# Patient Record
Sex: Male | Born: 1975 | Race: White | Hispanic: No | Marital: Married | Smoking: Never smoker
Health system: Southern US, Community
[De-identification: ages and names within clinical notes are randomized; demographics above are authoritative.]

## PROBLEM LIST (undated history)

## (undated) DIAGNOSIS — K219 Gastro-esophageal reflux disease without esophagitis: Secondary | ICD-10-CM

## (undated) HISTORY — PX: HERNIA REPAIR: SHX51

---

## 2015-08-16 ENCOUNTER — Emergency Department (HOSPITAL_COMMUNITY)
Admission: EM | Admit: 2015-08-16 | Discharge: 2015-08-16 | Disposition: A | Discharge status: Home / Self Care | Payer: BC Managed Care – PPO | Source: Home / Self Care | Attending: Emergency Medicine | Admitting: Emergency Medicine

## 2015-08-16 ENCOUNTER — Other Ambulatory Visit (HOSPITAL_COMMUNITY)
Admission: RE | Admit: 2015-08-16 | Discharge: 2015-08-16 | Disposition: A | Discharge status: Home / Self Care | Payer: BC Managed Care – PPO | Source: Ambulatory Visit | Attending: Emergency Medicine | Admitting: Emergency Medicine

## 2015-08-16 DIAGNOSIS — R6889 Other general symptoms and signs: Secondary | ICD-10-CM | POA: Insufficient documentation

## 2015-08-16 LAB — POCT RAPID STREP A: Streptococcus, Group A Screen (Direct): NEGATIVE

## 2015-08-16 MED ORDER — OSELTAMIVIR PHOSPHATE 6 MG/ML PO SUSR: 75.0000 mg | Freq: Two times a day (BID) | ORAL | Status: DC

## 2015-08-16 MED ORDER — ACETAMINOPHEN 325 MG PO TABS
ORAL_TABLET | ORAL | Status: AC
  Filled 2015-08-16: qty 2

## 2015-08-16 MED ORDER — ACETAMINOPHEN 325 MG PO TABS
650.0000 mg | ORAL_TABLET | Freq: Once | ORAL | Status: AC
  Administered 2015-08-16: 650 mg via ORAL

## 2015-08-16 NOTE — ED Notes (Signed)
One day history of sore throat and fever, cough, headache, general aches and pain.  Clear to yellow/green phlegm intermittently

## 2015-08-16 NOTE — Discharge Instructions (Signed)
Influenza, Adult Influenza ("the flu") is a viral infection of the respiratory tract. It occurs more often in winter months because people spend more time in close contact with one another. Influenza can make you feel very sick. Influenza easily spreads from person to person (contagious). CAUSES  Influenza is caused by a virus that infects the respiratory tract. You can catch the virus by breathing in droplets from an infected person's cough or sneeze. You can also catch the virus by touching something that was recently contaminated with the virus and then touching your mouth, nose, or eyes. RISKS AND COMPLICATIONS You may be at risk for a more severe case of influenza if you smoke cigarettes, have diabetes, have chronic heart disease (such as heart failure) or lung disease (such as asthma), or if you have a weakened immune system. Elderly people and pregnant women are also at risk for more serious infections. The most common problem of influenza is a lung infection (pneumonia). Sometimes, this problem can require emergency medical care and may be life threatening. SIGNS AND SYMPTOMS  Symptoms typically last 4 to 10 days and may include:  Fever.  Chills.  Headache, body aches, and muscle aches.  Sore throat.  Chest discomfort and cough.  Poor appetite.  Weakness or feeling tired.  Dizziness.  Nausea or vomiting. DIAGNOSIS  Diagnosis of influenza is often made based on your history and a physical exam. A nose or throat swab test can be done to confirm the diagnosis. TREATMENT  In mild cases, influenza goes away on its own. Treatment is directed at relieving symptoms. For more severe cases, your health care provider may prescribe antiviral medicines to shorten the sickness. Antibiotic medicines are not effective because the infection is caused by a virus, not by bacteria. HOME CARE INSTRUCTIONS  Take medicines only as directed by your health care provider.  Use a cool mist humidifier  to make breathing easier.  Get plenty of rest until your temperature returns to normal. This usually takes 3 to 4 days.  Drink enough fluid to keep your urine clear or pale yellow.  Cover yourmouth and nosewhen coughing or sneezing,and wash your handswellto prevent thevirusfrom spreading.  Stay homefromwork orschool untilthe fever is gonefor at least 64full day. PREVENTION  An annual influenza vaccination (flu shot) is the best way to avoid getting influenza. An annual flu shot is now routinely recommended for all adults in the [place] IF:  You experiencechest pain, yourcough worsens,or you producemore mucus.  Youhave nausea,vomiting, ordiarrhea.  Your fever returns or gets worse. SEEK IMMEDIATE MEDICAL CARE IF:  You havetrouble breathing, you become short of breath,or your skin ornails becomebluish.  You have severe painor stiffnessin the neck.  You develop a sudden headache, or pain in the face or ear.  You have nausea or vomiting that you cannot control. MAKE SURE YOU:   Understand these instructions.  Will watch your condition.  Will get help right away if you are not doing well or get worse.   This information is not intended to replace advice given to you by your health care provider. Make sure you discuss any questions you have with your health care provider.   Document Released: 06/27/2000 Document Revised: 07/21/2014 Document Reviewed: 09/29/2011 Elsevier Interactive Patient Education 2016 Elsevier Inc. Oseltamivir oral suspension What is this medicine? OSELTAMIVIR (os el TAM i vir) is an antiviral medicine. It is used to prevent and to treat some kinds of influenza or the flu. It will not  work for colds or other viral infections. This medicine may be used for other purposes; ask your health care provider or pharmacist if you have questions. What should I tell my health care provider before I take this medicine? They need  to know if you have any of the following conditions: -heart disease -hereditary fructose intolerance -immune system problems -kidney disease -liver disease -lung disease -an unusual or allergic reaction to oseltamivir, other medicines, foods, dyes, or preservatives -pregnant or trying to get pregnant -breast-feeding How should I use this medicine? Take this medicine by mouth with a glass of water. Follow the directions on the prescription label. Start this medicine at the first sign of flu symptoms. Shake well before using. Use the oral syringe provided to measure the dose. Place the medicine directly into the mouth. Do not mix with any other liquid. Rinse the oral syringe and dry before the next use. You can take it with or without food. If it upsets your stomach, take it with food. Take your medicine at regular intervals. Do not take your medicine more often than directed. Take all of your medicine as directed even if you think you are better. Do not skip doses or stop your medicine early. Talk to your pediatrician regarding the use of this medicine in children. While this drug may be prescribed for children as young as 14 days for selected conditions, precautions do apply. Overdosage: If you think you have taken too much of this medicine contact a poison control center or emergency room at once. NOTE: This medicine is only for you. Do not share this medicine with others. What if I miss a dose? If you miss a dose, take it as soon as you remember. If it is almost time for your next dose (within 2 hours), take only that dose. Do not take double or extra doses. What may interact with this medicine? Interactions are not expected. This list may not describe all possible interactions. Give your health care provider a list of all the medicines, herbs, non-prescription drugs, or dietary supplements you use. Also tell them if you smoke, drink alcohol, or use illegal drugs. Some items may interact with  your medicine. What should I watch for while using this medicine? Visit your doctor or health care professional for regular check ups. Tell your doctor if your symptoms do not start to get better or if they get worse. If you have the flu, you may be at an increased risk of developing seizures, confusion, or abnormal behavior. This occurs early in the illness, and more frequently in children and teens. These events are not common, but may result in accidental injury to the patient. Families and caregivers of patients should watch for signs of unusual behavior and contact a doctor or health care professional right away if the patient shows signs of unusual behavior. This medicine is not a substitute for the flu shot. Talk to your doctor each year about an annual flu shot. What side effects may I notice from receiving this medicine? Side effects that you should report to your doctor or health care professional as soon as possible: -allergic reactions like skin rash, itching or hives, swelling of the face, lips, or tongue -anxiety, confusion, unusual behavior -breathing problems -hallucination, loss of contact with reality -redness, blistering, peeling or loosening of the skin, including inside the mouth -seizures Side effects that usually do not require medical attention (report to your doctor or health care professional if they continue or are bothersome): -  diarrhea -headache -nausea, vomiting -pain This list may not describe all possible side effects. Call your doctor for medical advice about side effects. You may report side effects to FDA at 1-800-FDA-1088. Where should I keep my medicine? Keep out of the reach of children. After this medicine is mixed by your pharmacist, store it in the refrigerator at 2 to 8 degrees C (36 to 46 degrees F). Do not freeze. Throw away any unused medicine after 10 days. NOTE: This sheet is a summary. It may not cover all possible information. If you have  questions about this medicine, talk to your doctor, pharmacist, or health care provider.    2016, Elsevier/Gold Standard. (2015-01-03 10:43:24)

## 2015-08-16 NOTE — ED Provider Notes (Signed)
CSN: [id number]     Arrival date & time 08/16/15  1917 History   First MD Initiated Contact with Patient 08/16/15 2012     Chief Complaint  Patient presents with  . Sore Throat  . Fever   (Consider location/radiation/quality/duration/timing/severity/associated sxs/prior Treatment) HPI Cough, fever, body aches, sore throat symptoms started today temp is as high as 102. Tylenol for fever. 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  No past medical history on file. No past surgical history on file. No family history on file. Social History  Substance Use Topics  . Smoking status: Not on file  . Smokeless tobacco: Not on file  . Alcohol Use: Not on file    Review of Systems ROS +'ve cough body aches, fever  Denies: HEADACHE, NAUSEA, ABDOMINAL PAIN, CHEST PAIN, CONGESTION, DYSURIA, SHORTNESS OF BREATH  Allergies  Review of patient's allergies indicates not on file.  Home Medications   Prior to Admission medications   Medication Sig Start Date End Date Taking? Authorizing Provider  oseltamivir (TAMIFLU) 6 MG/ML SUSR suspension Take 12.5 mLs (75 mg total) by mouth 2 (two) times daily. 08/16/15   Konrad Felix, PA   Meds Ordered and Administered this Visit   Medications  acetaminophen (TYLENOL) tablet 650 mg (650 mg Oral Given 08/16/15 2015)    BP 125/83 mmHg  Pulse 129  Temp(Src) 102.9 F (39.4 C) (Oral)  Resp 16  SpO2 94% No data found.   Physical Exam  Constitutional: He is oriented to person, place, and time. He appears well-developed and well-nourished. No distress.  HENT:  Head: Normocephalic and atraumatic.  Right Ear: External ear normal.  Left Ear: External ear normal.  Mouth/Throat: Oropharynx is clear and moist. No oropharyngeal exudate.  Eyes: Conjunctivae are normal.  Neck: Normal range of motion. Neck supple.  Cardiovascular: Normal rate.   Pulmonary/Chest: Effort normal and breath sounds normal. No  respiratory distress. He has no wheezes. He exhibits no tenderness.  Abdominal: Soft.  Musculoskeletal: Normal range of motion.  Neurological: He is alert and oriented to person, place, and time.  Skin: Skin is warm and dry.  Psychiatric: He has a normal mood and affect. His behavior is normal.    ED Course  Procedures (including critical care time)  Labs Review Labs Reviewed  POCT RAPID STREP A    Imaging Review No results found.   Visual Acuity Review  Right Eye Distance:   Left Eye Distance:   Bilateral Distance:    Right Eye Near:   Left Eye Near:    Bilateral Near:        Reviews rapid strep test with patient and his wife. Result is negative.  MDM   1. Flu-like symptoms    Patient is advised to continue home symptomatic treatment. Prescription for Tamiflu  sent pharmacy patient has indicated. Patient is advised that if there are new or worsening symptoms or attend the emergency department, or contact primary care provider. Instructions of care provided discharged home in stable condition. Return to work/school note provided.  THIS NOTE WAS GENERATED USING A VOICE RECOGNITION SOFTWARE PROGRAM. ALL REASONABLE EFFORTS  WERE MADE TO PROOFREAD THIS DOCUMENT FOR ACCURACY.     Konrad Felix, PA 08/16/15 2039

## 2015-08-17 ENCOUNTER — Encounter (HOSPITAL_COMMUNITY): Payer: Self-pay | Admitting: Emergency Medicine

## 2015-08-19 LAB — CULTURE, GROUP A STREP (THRC)

## 2019-06-14 ENCOUNTER — Other Ambulatory Visit: Payer: Self-pay | Admitting: Urology

## 2019-06-14 ENCOUNTER — Other Ambulatory Visit: Payer: Self-pay

## 2019-06-14 DIAGNOSIS — Z20822 Contact with and (suspected) exposure to covid-19: Secondary | ICD-10-CM

## 2019-06-14 DIAGNOSIS — D4411 Neoplasm of uncertain behavior of right adrenal gland: Secondary | ICD-10-CM

## 2019-06-16 LAB — NOVEL CORONAVIRUS, NAA: SARS-CoV-2, NAA: NOT DETECTED

## 2019-07-12 ENCOUNTER — Other Ambulatory Visit: Payer: BC Managed Care – PPO

## 2019-08-06 ENCOUNTER — Other Ambulatory Visit: Payer: Self-pay

## 2019-08-06 ENCOUNTER — Ambulatory Visit
Admission: RE | Admit: 2019-08-06 | Discharge: 2019-08-06 | Disposition: A | Discharge status: Home / Self Care | Payer: BC Managed Care – PPO | Source: Ambulatory Visit | Attending: Urology | Admitting: Urology

## 2019-08-06 DIAGNOSIS — D4411 Neoplasm of uncertain behavior of right adrenal gland: Secondary | ICD-10-CM

## 2019-08-06 MED ORDER — GADOBENATE DIMEGLUMINE 529 MG/ML IV SOLN
20.0000 mL | Freq: Once | INTRAVENOUS | Status: AC | PRN
  Administered 2019-08-06: 13:00:00 20 mL via INTRAVENOUS

## 2019-12-08 ENCOUNTER — Other Ambulatory Visit: Payer: Self-pay | Admitting: Urology

## 2019-12-08 DIAGNOSIS — D4411 Neoplasm of uncertain behavior of right adrenal gland: Secondary | ICD-10-CM

## 2020-01-28 ENCOUNTER — Ambulatory Visit
Admission: RE | Admit: 2020-01-28 | Discharge: 2020-01-28 | Disposition: A | Discharge status: Home / Self Care | Payer: BC Managed Care – PPO | Source: Ambulatory Visit | Attending: Urology | Admitting: Urology

## 2020-01-28 ENCOUNTER — Other Ambulatory Visit: Payer: Self-pay

## 2020-01-28 DIAGNOSIS — D4411 Neoplasm of uncertain behavior of right adrenal gland: Secondary | ICD-10-CM

## 2020-01-28 MED ORDER — GADOBENATE DIMEGLUMINE 529 MG/ML IV SOLN
20.0000 mL | Freq: Once | INTRAVENOUS | Status: AC | PRN
  Administered 2020-01-28: 20 mL via INTRAVENOUS

## 2020-03-08 ENCOUNTER — Ambulatory Visit: Payer: BC Managed Care – PPO | Attending: Internal Medicine

## 2020-03-08 DIAGNOSIS — Z23 Encounter for immunization: Secondary | ICD-10-CM

## 2020-03-08 NOTE — Progress Notes (Signed)
   Covid-19 Vaccination Clinic  Name:  John [name]    MRN: [id number] DOB: [birth year]  03/08/2020  John [name] was observed post Covid-19 immunization for 15 minutes without incident. He was provided with Vaccine Information Sheet and instruction to access the V-Safe system.   John [name] was instructed to call 911 with any severe reactions post vaccine: Marland Kitchen Difficulty breathing  . Swelling of face and throat  . A fast heartbeat  . A bad rash all over body  . Dizziness and weakness   Immunizations Administered    Name Date Dose VIS Date Route   Pfizer COVID-19 Vaccine 03/08/2020  1:45 PM 0.3 mL 09/07/2018 Intramuscular   Manufacturer: [place]   Lot: [id number]   [place]: [address]-7

## 2020-03-29 ENCOUNTER — Ambulatory Visit: Payer: BC Managed Care – PPO | Attending: Internal Medicine

## 2020-03-29 DIAGNOSIS — Z23 Encounter for immunization: Secondary | ICD-10-CM

## 2020-03-29 NOTE — Progress Notes (Signed)
.     Covid-19 Vaccination Clinic  Name:  John [name]    MRN: [id number] DOB: [birth year]  03/29/2020  [name] was observed post Covid-19 immunization for 15 minutes without incident. He was provided with Vaccine Information Sheet and instruction to access the V-Safe system.   [name] was instructed to call 911 with any severe reactions post vaccine: Marland Kitchen Difficulty breathing  . Swelling of face and throat  . A fast heartbeat  . A bad rash all over body  . Dizziness and weakness   Immunizations Administered    Name Date Dose VIS Date Route   Pfizer COVID-19 Vaccine 03/29/2020  2:11 PM 0.3 mL 09/07/2018 Intramuscular   Manufacturer: [place]   Lot: [id number]   [place]: [id number]

## 2021-11-07 IMAGING — MR MR ABDOMEN WO/W CM
13 of 18 series · 27 of 48 positions shown · IV contrast (multihance)
Comparison: Abdominal MRI 08/06/2019.

CLINICAL DATA: 43-year-old male with history of adrenal mass.
Follow-up study.

EXAM:
MRI ABDOMEN WITHOUT AND WITH CONTRAST
TECHNIQUE: Multiplanar multisequence MR imaging of the abdomen was performed
both before and after the administration of intravenous contrast.
CONTRAST:  20mL MULTIHANCE GADOBENATE DIMEGLUMINE 529 MG/ML IV SOLN

[Series 3: T2 · coronal · 5.0mm · 1.56mm/px · 1 of 36 slices shown (1 of 3)]
[im 1/36]
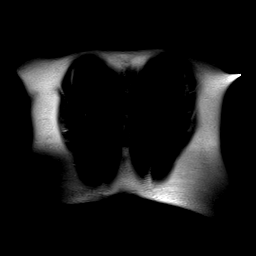

[Series 4: T2 · axial · 5.0mm · 1.56mm/px · 1 of 40 slices shown (2 of 3)]
[im 1/40]
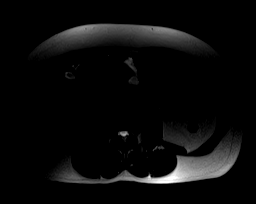

[Series 5: T2 · axial · 4.0mm · 0.78mm/px · 1 of 47 slices shown (3 of 3)]
[im 1/47]
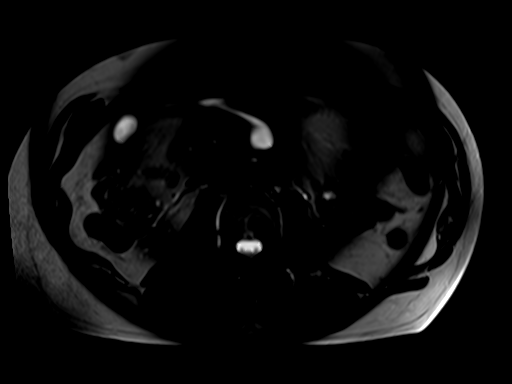

[Series 6: ep2d_diff_b50_500_800_p2-resp · 1 of 8 slices shown]
[im 1/8]
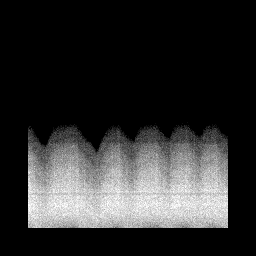

[Series 7: ep2d_diff_b50_500_800_p2 · axial · 6.0mm · 2.08mm/px · z∈[-88,+115]mm · 2 of 81 slices shown]
[im 1/81]
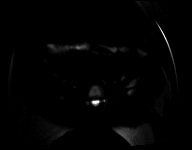
[im 81/81]
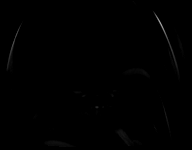

[Series 8: ep2d_diff_b50_500_800_p2_adc · axial · 6.0mm · 2.08mm/px · 1 of 27 slices shown]
[im 1/27]
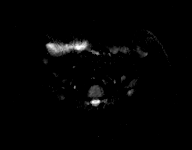

[Series 9: axial in out · axial · 5.5mm · 0.78mm/px · 1 of 64 slices shown]
[im 1/64]
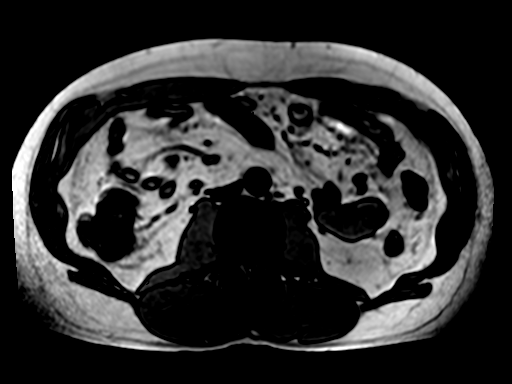

[Series 10: cor in out · coronal · 5.5mm · 0.78mm/px · 1 of 62 slices shown]
[im 1/62]
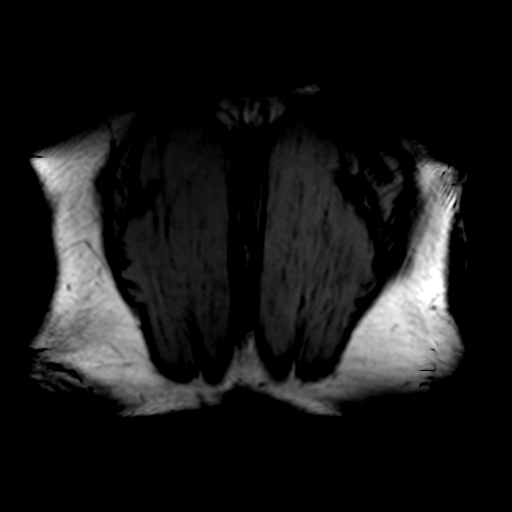

[Series 11: T1 dynamic · axial · non-contrast · 2.2mm · 0.82mm/px · z∈[-105,+122]mm · 4 of 104 slices shown]
[im 1/104]
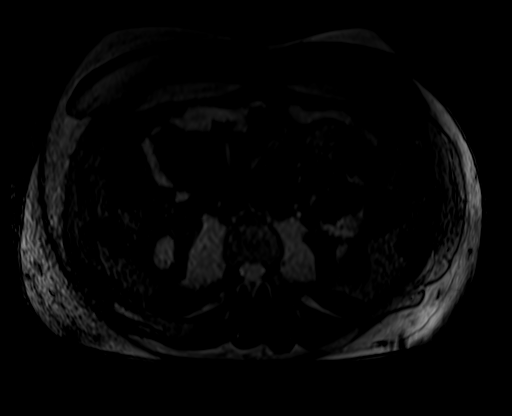
[im 35/104]
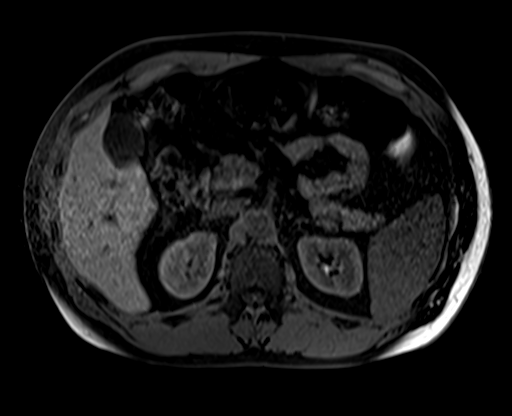
[im 69/104]
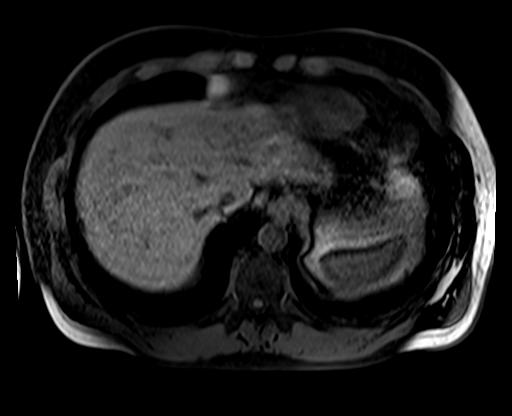
[im 104/104]
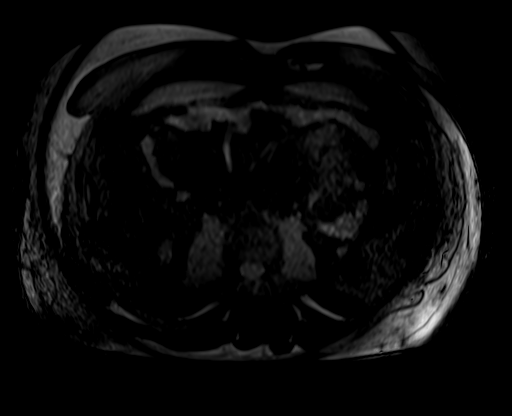

[Series 12: post 25 sec · axial · 2.2mm · 0.82mm/px · z∈[-105,+122]mm · 4 of 104 slices shown]
[im 1/104]
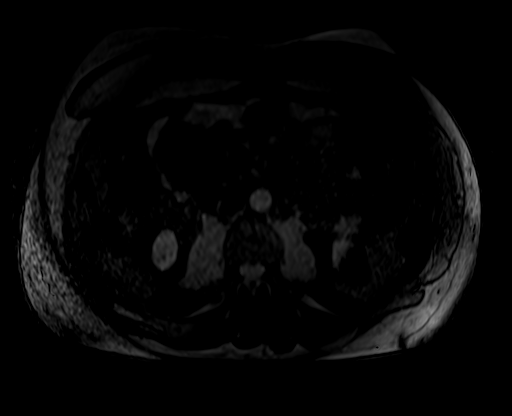
[im 35/104]
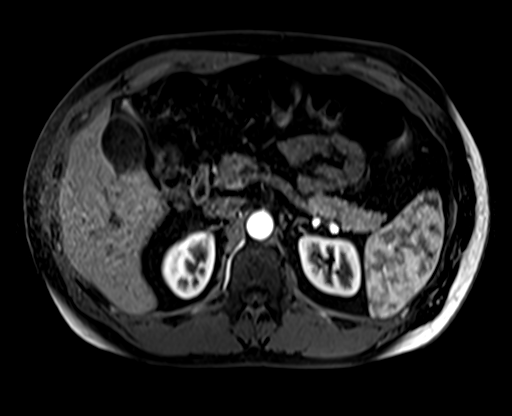
[im 69/104]
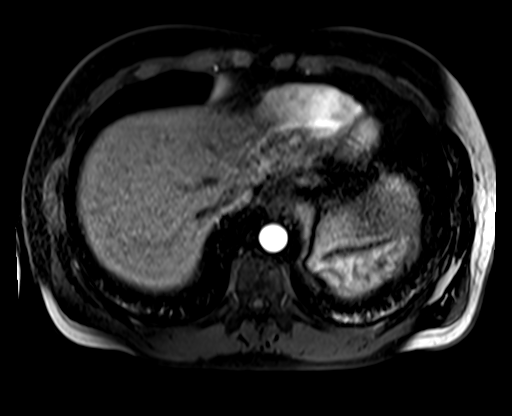
[im 104/104]
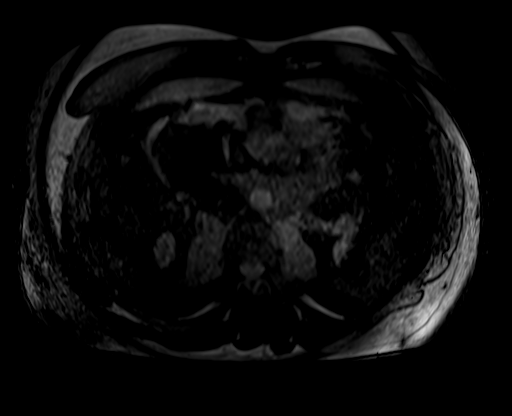

[Series 13: post 25 sec_sub · axial · 2.2mm · 0.82mm/px · z∈[-105,+122]mm · 4 of 104 slices shown]
[im 1/104]
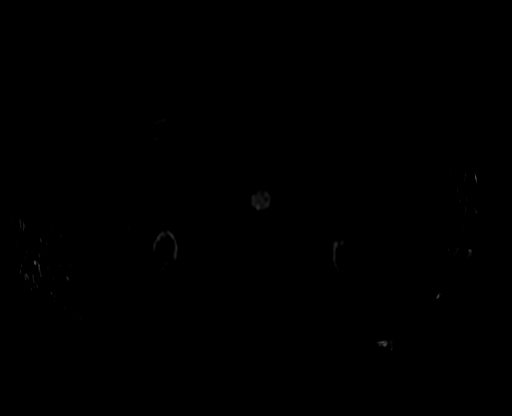
[im 35/104]
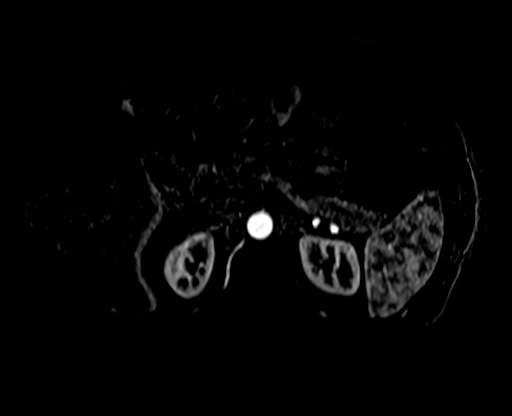
[im 69/104]
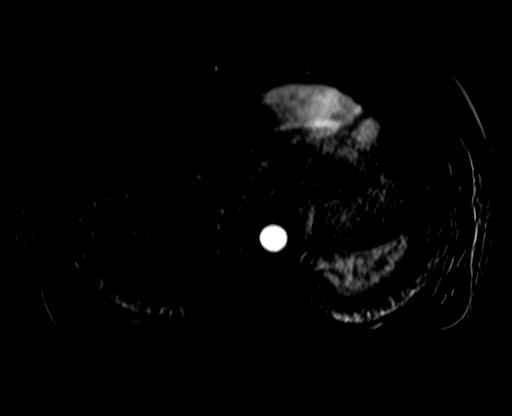
[im 104/104]
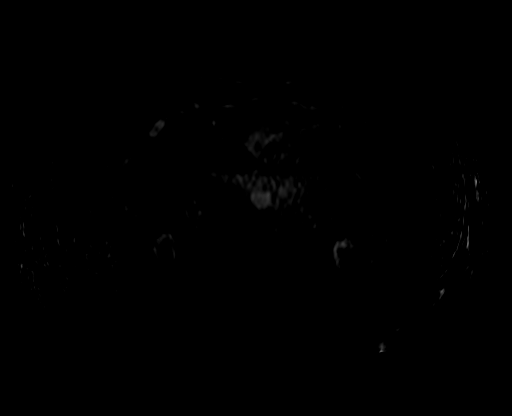

[Series 14: post 45 sec · axial · 2.2mm · 0.82mm/px · z∈[-105,+122]mm · 4 of 104 slices shown]
[im 1/104]
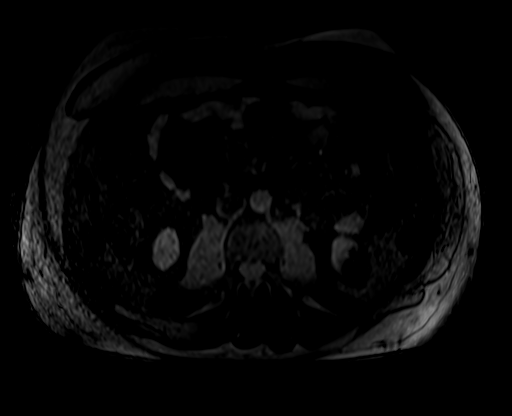
[im 35/104]
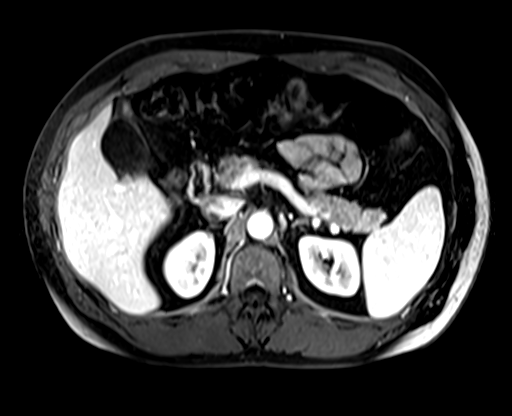
[im 69/104]
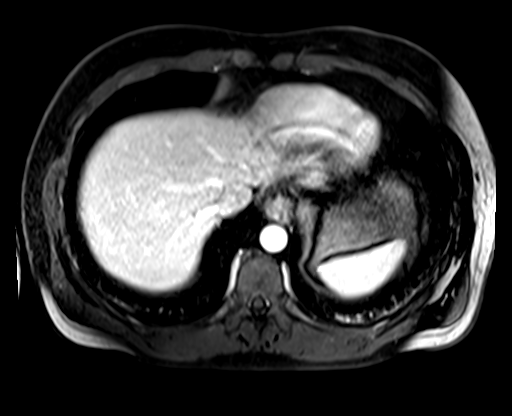
[im 104/104]
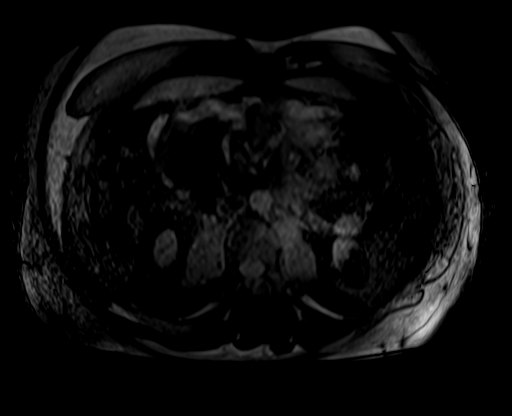

[Series 15: post 45 sec_sub · axial · 2.2mm · 0.82mm/px · z∈[-105,-30]mm · 2 of 104 slices shown]
[im 1/104]
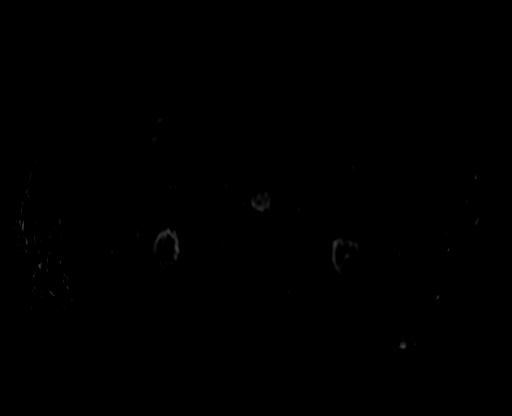
[im 35/104]
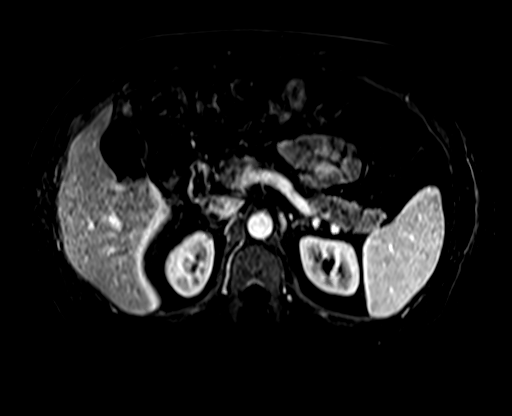

[27 of 48 positions shown; findings below may reference images not displayed]

FINDINGS: Lower chest: Unremarkable.

Hepatobiliary: No suspicious cystic or solid hepatic lesions. No
intra or extrahepatic biliary ductal dilatation gallbladder is
normal in appearance.

Pancreas: No pancreatic mass. No pancreatic ductal dilatation. No
pancreatic or peripancreatic fluid collections or inflammatory
changes.

Spleen:  Unremarkable.

Adrenals/Urinary Tract: Previously noted right adrenal lesion is
similar in size and appearance currently measuring 1.4 x 1.3 x
cm (axial image 15 of series 4 and coronal image 14 of series 3),
again demonstrating low T1 signal intensity, high T2 signal
intensity and no internal enhancement. Bilateral kidneys in the left
adrenal gland are normal in appearance. No hydroureteronephrosis in
the visualized portions of the abdomen.

Stomach/Bowel: Visualized portions are unremarkable.

Vascular/Lymphatic: No aneurysm identified in the visualized
portions of the abdomen. No lymphadenopathy noted in the visualized
portions of the abdomen.

Other: No significant volume of ascites noted in the visualized
portions of the peritoneal cavity.

Musculoskeletal: No aggressive appearing osseous lesions are noted
in the visualized portions of the skeleton.
IMPRESSION: 1. Stable size and appearance of benign appearing cystic lesion in
the right adrenal gland, most likely to represent a small adrenal
cyst. This is strongly favored to be benign. An additional follow-up
abdominal MRI with and without IV gadolinium could be considered in
12 months to ensure continued stability.

## 2022-03-20 ENCOUNTER — Ambulatory Visit: Payer: BC Managed Care – PPO | Admitting: Nurse Practitioner

## 2022-08-04 ENCOUNTER — Ambulatory Visit: Payer: BC Managed Care – PPO | Admitting: Family Medicine

## 2022-08-04 ENCOUNTER — Encounter: Payer: Self-pay | Admitting: Family Medicine

## 2022-08-04 VITALS — BP 125/91 | HR 80 | Temp 97.9°F | Ht 73.0 in | Wt 241.2 lb

## 2022-08-04 DIAGNOSIS — R0789 Other chest pain: Secondary | ICD-10-CM

## 2022-08-04 DIAGNOSIS — G2581 Restless legs syndrome: Secondary | ICD-10-CM | POA: Diagnosis not present

## 2022-08-04 DIAGNOSIS — R002 Palpitations: Secondary | ICD-10-CM | POA: Diagnosis not present

## 2022-08-04 DIAGNOSIS — K2100 Gastro-esophageal reflux disease with esophagitis, without bleeding: Secondary | ICD-10-CM

## 2022-08-04 MED ORDER — FLUTICASONE FUROATE-VILANTEROL 100-25 MCG/ACT IN AEPB: 1.0000 | INHALATION_SPRAY | Freq: Every day | RESPIRATORY_TRACT | 11 refills | Status: DC

## 2022-08-04 MED ORDER — PANTOPRAZOLE SODIUM 40 MG PO TBEC: 40.0000 mg | DELAYED_RELEASE_TABLET | Freq: Every day | ORAL | 11 refills | Status: DC

## 2022-08-04 NOTE — Progress Notes (Signed)
Subjective:  Patient ID: John [name], male    DOB: Jan 13, 1976  Age: 47 y.o. MRN: [id number]  CC: Dizziness, Chest Pain, and Fatigue   HPI Admiral Marcucci presents for dyspnea on exertion for walking an incline. Onset 1 year.HE can walk well on a level surface, even referees basketball games.  This AM on awakening had palpitations and chest pain. Lightheaded this AM. Felt like anxiety. Jittery inside. Denies extreme stressors. Married 28 years. Pretty good relationnship. Cough accompanies sometimes has to cough to get his breath.   Right leg feels tension and release, leg jumps. Occurs nightly.Job mostly desk work.      08/04/2022   12:58 PM  Depression screen PHQ 2/9  Decreased Interest 0  Down, Depressed, Hopeless 0  PHQ - 2 Score 0    History Davarious has no past medical history on file.   He has no past surgical history on file.   His family history is not on file.He reports that he has never smoked. He has never used smokeless tobacco. He reports that he does not currently use alcohol. He reports that he does not currently use drugs.    ROS Review of Systems  Constitutional:  Negative for fever.  Respiratory:  Positive for shortness of breath.   Cardiovascular:  Positive for chest pain and palpitations (see HPI).  Musculoskeletal:  Negative for arthralgias.  Skin:  Negative for rash.    Objective:  BP (!) 125/91   Pulse 80   Temp 97.9 F (36.6 C)   Ht '6\' 1"'$  (1.854 m)   Wt 241 lb 3.2 oz (109.4 kg)   SpO2 96%   BMI 31.82 kg/m   BP Readings from Last 3 Encounters:  08/04/22 (!) 125/91  08/16/15 125/83    Wt Readings from Last 3 Encounters:  08/04/22 241 lb 3.2 oz (109.4 kg)     Physical Exam Vitals reviewed.  Constitutional:      Appearance: He is well-developed.  HENT:     Head: Normocephalic and atraumatic.     Right Ear: External ear normal.     Left Ear: External ear normal.     Mouth/Throat:     Pharynx: No oropharyngeal exudate or posterior  oropharyngeal erythema.  Eyes:     Pupils: Pupils are equal, round, and reactive to light.  Cardiovascular:     Rate and Rhythm: Normal rate and regular rhythm.     Heart sounds: No murmur heard. Pulmonary:     Effort: No respiratory distress.     Breath sounds: Normal breath sounds.  Musculoskeletal:     Cervical back: Normal range of motion and neck supple.  Neurological:     Mental Status: He is alert and oriented to person, place, and time.    EKG : isolated upright qrs in V2. Otherwise nml R wave progession. No sign of ischemia.    Assessment & Plan:   Jarquavious was seen today for dizziness, chest pain and fatigue.  Diagnoses and all orders for this visit:  Chest tightness -     EKG 12-Lead -     CBC with Differential/Platelet -     CMP14+EGFR -     Lipid panel -     Ambulatory referral to Cardiology  Palpitation -     CBC with Differential/Platelet -     CMP14+EGFR -     Lipid panel -     Ambulatory referral to Cardiology  Restless leg syndrome  Gastroesophageal reflux disease with esophagitis without  hemorrhage -     CBC with Differential/Platelet -     CMP14+EGFR  Other orders -     pantoprazole (PROTONIX) 40 MG tablet; Take 1 tablet (40 mg total) by mouth daily. For stomach -     fluticasone furoate-vilanterol (BREO ELLIPTA) 100-25 MCG/ACT AEPB; Inhale 1 puff into the lungs daily.    Restless leg deferred to first visit with his PCP on Feb. 1.    I have discontinued Harsh Nigg's oseltamivir. I am also having him start on pantoprazole and fluticasone furoate-vilanterol.  Allergies as of 08/04/2022   No Known Allergies      Medication List        Accurate as of August 04, 2022  9:15 PM. If you have any questions, ask your nurse or doctor.          STOP taking these medications    oseltamivir 6 MG/ML Susr suspension Commonly known as: TAMIFLU Stopped by: Claretta Fraise, MD       TAKE these medications    fluticasone  furoate-vilanterol 100-25 MCG/ACT Aepb Commonly known as: Breo Ellipta Inhale 1 puff into the lungs daily. Started by: Claretta Fraise, MD   pantoprazole 40 MG tablet Commonly known as: PROTONIX Take 1 tablet (40 mg total) by mouth daily. For stomach Started by: Claretta Fraise, MD       His sx this AM may be related to his reflux. However, the DOE is of concern for heart. He has no risk factors except second hand smke as a child. Nonsmoker. Will have him use inhaler for now until more definitice workup is completed, including stress echo with cardiology.   Follow-up: Return in about 10 days (around 08/14/2022) for with PCP for recheck of heart sx as well as reflux, restless leg.Claretta Fraise, M.D.

## 2022-08-06 ENCOUNTER — Other Ambulatory Visit: Payer: BC Managed Care – PPO

## 2022-08-06 LAB — CMP14+EGFR
BUN: 12 mg/dL (ref 6–24)
CO2: 23 mmol/L (ref 20–29)
Chloride: 105 mmol/L (ref 96–106)
Glucose: 92 mg/dL (ref 70–99)

## 2022-08-06 LAB — CBC WITH DIFFERENTIAL/PLATELET
Basophils Absolute: 0.1 10*3/uL (ref 0.0–0.2)
Basos: 1 %
EOS (ABSOLUTE): 0.1 10*3/uL (ref 0.0–0.4)
Eos: 3 %
Immature Grans (Abs): 0 10*3/uL (ref 0.0–0.1)
Immature Granulocytes: 0 %
Lymphs: 42 %
Monocytes Absolute: 0.3 10*3/uL (ref 0.1–0.9)
Monocytes: 7 %

## 2022-08-06 LAB — LIPID PANEL
Chol/HDL Ratio: 5.3 ratio — ABNORMAL HIGH (ref 0.0–5.0)
Cholesterol, Total: 174 mg/dL (ref 100–199)
HDL: 33 mg/dL — ABNORMAL LOW (ref >39)
LDL Chol Calc (NIH): 118 mg/dL — ABNORMAL HIGH (ref 0–99)
Triglycerides: 128 mg/dL (ref 0–149)
VLDL Cholesterol Cal: 23 mg/dL (ref 5–40)

## 2022-08-07 LAB — CBC WITH DIFFERENTIAL/PLATELET
Hematocrit: 44.3 % (ref 37.5–51.0)
Hemoglobin: 15 g/dL (ref 13.0–17.7)
Lymphocytes Absolute: 1.8 10*3/uL (ref 0.7–3.1)
MCH: 28.2 pg (ref 26.6–33.0)
MCHC: 33.9 g/dL (ref 31.5–35.7)
MCV: 83 fL (ref 79–97)
Neutrophils Absolute: 2.1 10*3/uL (ref 1.4–7.0)
Neutrophils: 47 %
Platelets: 223 10*3/uL (ref 150–450)
RBC: 5.32 x10E6/uL (ref 4.14–5.80)
RDW: 12.7 % (ref 11.6–15.4)
WBC: 4.4 10*3/uL (ref 3.4–10.8)

## 2022-08-07 LAB — CMP14+EGFR
ALT: 21 IU/L (ref 0–44)
AST: 22 IU/L (ref 0–40)
Albumin/Globulin Ratio: 1.4 (ref 1.2–2.2)
Albumin: 4.2 g/dL (ref 4.1–5.1)
Alkaline Phosphatase: 85 IU/L (ref 44–121)
BUN/Creatinine Ratio: 9 (ref 9–20)
Bilirubin Total: 0.5 mg/dL (ref 0.0–1.2)
Calcium: 9.5 mg/dL (ref 8.7–10.2)
Creatinine, Ser: 1.34 mg/dL — ABNORMAL HIGH (ref 0.76–1.27)
Globulin, Total: 2.9 g/dL (ref 1.5–4.5)
Potassium: 4.2 mmol/L (ref 3.5–5.2)
Sodium: 141 mmol/L (ref 134–144)
eGFR: 66 mL/min/{1.73_m2} (ref >59)

## 2022-08-14 ENCOUNTER — Ambulatory Visit: Payer: BC Managed Care – PPO | Admitting: Family Medicine

## 2022-08-14 ENCOUNTER — Ambulatory Visit: Payer: BC Managed Care – PPO | Attending: Internal Medicine | Admitting: Internal Medicine

## 2022-08-14 ENCOUNTER — Encounter: Payer: Self-pay | Admitting: Internal Medicine

## 2022-08-14 VITALS — BP 120/76 | HR 70 | Ht 73.0 in | Wt 242.2 lb

## 2022-08-14 DIAGNOSIS — R0602 Shortness of breath: Secondary | ICD-10-CM | POA: Diagnosis not present

## 2022-08-14 DIAGNOSIS — R002 Palpitations: Secondary | ICD-10-CM

## 2022-08-14 NOTE — Progress Notes (Signed)
Cardiology Office Note   Date:  08/14/2022   ID:  Reuben Knoblock, DOB [birth year], MRN [id number]  PCP:  Gwenlyn Perking, FNP  Cardiologist:   Dorris Carnes, MD    Patient referred for CP   History of Present Illness: John [name] is a 47 y.o. male who is followed by Dr Cherrie Distance   Seen earlier this month    Complained of DOE   On the morning of visit he woke up with  palpitatoins and chest pain.   Dizzy    Felt jittery    The pt said a few nights prior had reffed 3 basketball games    Running up and down court  that night he felt fine   he says he woke up in middle of night with calves hurting   Says he could nt sleep  Got better over the next day Next night went to hockey game  Walking up to seat he got very SOB walking up stairs. Pt says in yard going to barn he has gotten SOB  that is 50 yards   No tightness in chest though   If stressed will get tight, will feel neck cramping and leg cramping  Last felt good several months ago  No preceding viral infection  Of note the pt runs on the  treadmill several times per week   Each session runs  3.5 miles  at 6 miles/hour   No problems doing this Did  it earlier this week without problem  Notes coughing more  No trigger  THe pt was just started on Breo and Protonix empirically by PCP  Has restless leg   syndome   Current Meds  Medication Sig   fluticasone furoate-vilanterol (BREO ELLIPTA) 100-25 MCG/ACT AEPB Inhale 1 puff into the lungs daily.   pantoprazole (PROTONIX) 40 MG tablet Take 1 tablet (40 mg total) by mouth daily. For stomach     Allergies:   Patient has no known allergies.   No past medical history on file.  No past surgical history on file.   Social History:  The patient  reports that he has never smoked. He has never used smokeless tobacco. He reports that he does not currently use alcohol. He reports that he does not currently use drugs.   Family History:  The patient's neg for for premature CAD   ROS:  Please  see the history of present illness. All other systems are reviewed and  Negative to the above problem except as noted.    PHYSICAL EXAM: VS:  BP 120/76   Pulse 70   Ht '6\' 1"'$  (1.854 m)   Wt 242 lb 3.2 oz (109.9 kg)   SpO2 97%   BMI 31.95 kg/m   GEN: Well nourished, well developed, in no acute distress  HEENT: normal  Neck: no JVD, carotid bruits, Cardiac: RRR; no murmur  No Le  edema  Respiratory:  CTA but mild upper airway wheeze with forced expiration.  GI: soft, nontender, nondistended, + BS   MS: no deformity Moving all extremities   Skin: warm and dry, no rash Neuro:  Strength and sensation are intact Psych: euthymic mood, full affect   EKG:  EKG is not ordered today.  On 08/04/22  NSR    Lipid Panel    Component Value Date/Time   CHOL 174 08/06/2022 0952   TRIG 128 08/06/2022 0952   HDL 33 (L) 08/06/2022 0952   CHOLHDL 5.3 (H) 08/06/2022 0952   LDLCALC  118 (H) 08/06/2022 0952      Wt Readings from Last 3 Encounters:  08/14/22 242 lb 3.2 oz (109.9 kg)  08/04/22 241 lb 3.2 oz (109.4 kg)      ASSESSMENT    Dyspnea    Atypical for cardiac   Running on treadmill at 6 mile/hour pace without problem   20 min at a time   ? Reactive airways     Mild upper airway wheeze   Reflux may contrib to this  Agree ith PPI Would set up for PFTs    Would get echo to confirm LV systolic/diastolic function  2Neck tightness Associated with mental stress, not running   ? GI in origin (spasm of esohagus)   Follow  Follow up based on test results   Current medicines are reviewed at length with the patient today.  The patient does not have concerns regarding medicines.  Signed, Dorris Carnes, MD  08/14/2022 2:29 PM    [facility] [address], [place], [place]  [address] Phone: [phone]; Fax: [phone]

## 2022-08-14 NOTE — Patient Instructions (Signed)
Medication Instructions:   *If you need a refill on your cardiac medications before your next appointment, please call your pharmacy*   Lab Work:  If you have labs (blood work) drawn today and your tests are completely normal, you will receive your results only by: [place] (if you have MyChart) OR A paper copy in the mail If you have any lab test that is abnormal or we need to change your treatment, we will call you to review the results.   Testing/Procedures: Your physician has recommended that you have a pulmonary function test. Pulmonary Function Tests are a group of tests that measure how well air moves in and out of your lungs.  Your physician has requested that you have an echocardiogram. Echocardiography is a painless test that uses sound waves to create images of your heart. It provides your doctor with information about the size and shape of your heart and how well your heart's chambers and valves are working. This procedure takes approximately one hour. There are no restrictions for this procedure. Please do NOT wear cologne, perfume, aftershave, or lotions (deodorant is allowed). Please arrive 15 minutes prior to your appointment time.    Follow-Up: At [facility], you and your health needs are our priority.  As part of our continuing mission to provide you with exceptional heart care, we have created designated Provider Care Teams.  These Care Teams include your primary Cardiologist (physician) and Advanced Practice Providers (APPs -  Physician Assistants and Nurse Practitioners) who all work together to provide you with the care you need, when you need it.  We recommend signing up for the patient portal called "MyChart".  Sign up information is provided on this After Visit Summary.  MyChart is used to connect with patients for Virtual Visits (Telemedicine).  Patients are able to view lab/test results, encounter notes, upcoming appointments, etc.  Non-urgent  messages can be sent to your provider as well.   To learn more about what you can do with MyChart, go to [email or web address].

## 2022-08-18 ENCOUNTER — Ambulatory Visit (INDEPENDENT_AMBULATORY_CARE_PROVIDER_SITE_OTHER): Payer: BC Managed Care – PPO | Admitting: Internal Medicine

## 2022-08-18 DIAGNOSIS — R0602 Shortness of breath: Secondary | ICD-10-CM

## 2022-08-18 DIAGNOSIS — R002 Palpitations: Secondary | ICD-10-CM

## 2022-08-18 LAB — PULMONARY FUNCTION TEST
DL/VA % pred: 100 %
DL/VA: 4.48 ml/min/mmHg/L
DLCO cor % pred: 98 %
DLCO cor: 32.48 ml/min/mmHg
DLCO unc % pred: 98 %
DLCO unc: 32.48 ml/min/mmHg
FEF 25-75 Post: 4.12 L/sec
FEF 25-75 Pre: 3.7 L/sec
FEF2575-%Change-Post: 11 %
FEF2575-%Pred-Post: 104 %
FEF2575-%Pred-Pre: 93 %
FEV1-%Change-Post: 2 %
FEV1-%Pred-Post: 98 %
FEV1-%Pred-Pre: 96 %
FEV1-Post: 4.38 L
FEV1-Pre: 4.26 L
FEV1FVC-%Change-Post: 4 %
FEV1FVC-%Pred-Pre: 98 %
FEV6-%Change-Post: -1 %
FEV6-%Pred-Post: 98 %
FEV6-%Pred-Pre: 100 %
FEV6-Post: 5.43 L
FEV6-Pre: 5.53 L
FEV6FVC-%Change-Post: 0 %
FEV6FVC-%Pred-Post: 103 %
FEV6FVC-%Pred-Pre: 103 %
FVC-%Change-Post: -1 %
FVC-%Pred-Post: 95 %
FVC-%Pred-Pre: 97 %
FVC-Post: 5.44 L
FVC-Pre: 5.53 L
Post FEV1/FVC ratio: 81 %
Post FEV6/FVC ratio: 100 %
Pre FEV1/FVC ratio: 77 %
Pre FEV6/FVC Ratio: 100 %
RV % pred: 114 %
RV: 2.43 L
TLC % pred: 101 %
TLC: 7.7 L

## 2022-08-18 NOTE — Progress Notes (Signed)
Full PFT completed today 

## 2022-08-21 ENCOUNTER — Encounter: Payer: Self-pay | Admitting: Family Medicine

## 2022-08-21 ENCOUNTER — Ambulatory Visit: Payer: BC Managed Care – PPO | Admitting: Family Medicine

## 2022-08-21 VITALS — BP 114/68 | HR 70 | Temp 97.2°F | Ht 73.0 in | Wt 237.5 lb

## 2022-08-21 DIAGNOSIS — R0602 Shortness of breath: Secondary | ICD-10-CM | POA: Diagnosis not present

## 2022-08-21 DIAGNOSIS — K219 Gastro-esophageal reflux disease without esophagitis: Secondary | ICD-10-CM | POA: Diagnosis not present

## 2022-08-21 DIAGNOSIS — Z1211 Encounter for screening for malignant neoplasm of colon: Secondary | ICD-10-CM

## 2022-08-21 DIAGNOSIS — Z0001 Encounter for general adult medical examination with abnormal findings: Secondary | ICD-10-CM

## 2022-08-21 DIAGNOSIS — N529 Male erectile dysfunction, unspecified: Secondary | ICD-10-CM

## 2022-08-21 DIAGNOSIS — Z0000 Encounter for general adult medical examination without abnormal findings: Secondary | ICD-10-CM

## 2022-08-21 DIAGNOSIS — R5383 Other fatigue: Secondary | ICD-10-CM

## 2022-08-21 DIAGNOSIS — R6882 Decreased libido: Secondary | ICD-10-CM

## 2022-08-21 DIAGNOSIS — R1319 Other dysphagia: Secondary | ICD-10-CM | POA: Diagnosis not present

## 2022-08-21 MED ORDER — TADALAFIL 10 MG PO TABS: 10.0000 mg | ORAL_TABLET | ORAL | 1 refills | Status: DC | PRN

## 2022-08-21 NOTE — Progress Notes (Signed)
Complete physical exam  Patient: John [name]   DOB: 09-Oct-1975   47 y.o. Male  MRN: [id number]  Subjective:    Chief Complaint  Patient presents with   Annual Exam    John [name] is a 47 y.o. male who presents today for a complete physical exam. He reports consuming a general diet. Gym/ health club routine includes basketball, cardio, and mod to heavy weightlifting. He generally feels fairly well. He reports sleeping fairly well. He does have additional problems to discuss today.   John [name] has recently been seen for atypical chest pain, cough, and shortness of breath. He has seen cardiology. Cardiology felt like his symptoms were possibly pulmonology vs esophageal spasm. He then saw pulmonology and had normal PFTs. He was started on protonix about 1 week ago for this.  Reports symptoms wax and wane. Chest pain and shortness of breath is not exertional and occurs randomly but often when he is talking excitedly. When he exhales he feels some irritation in his throat and chest and feels the need to cough. This is intermittent and occurs primarily when he talking quickly as well. He does also have a cough at night. Its a dry cough. He sometimes gets choked on saliva and sometimes with food. He reports heartburn that occurs daily, worse at night. He reports water brash at night. Denies nausea, vomiting, weight loss, or early satiety.   He also would like to have his testosterone checked. He reports decreased libido and decreased energy as well as ED. He has seen urology in the past and had a normal work up. He was prescribed medication which did work well for him.      08/21/2022    1:42 PM 08/04/2022   12:58 PM  Depression screen PHQ 2/9  Decreased Interest 0 0  Down, Depressed, Hopeless 0 0  PHQ - 2 Score 0 0  Altered sleeping 3   Tired, decreased energy 2   Change in appetite 0   Feeling bad or failure about yourself  0   Trouble concentrating 0   Moving slowly or fidgety/restless 0    Suicidal thoughts 0   PHQ-9 Score 5   Difficult doing work/chores Somewhat difficult       08/21/2022    1:43 PM  GAD 7 : Generalized Anxiety Score  Nervous, Anxious, on Edge 0  Control/stop worrying 0  Worry too much - different things 3  Trouble relaxing 2  Restless 2  Easily annoyed or irritable 2  Afraid - awful might happen 0  Total GAD 7 Score 9  Anxiety Difficulty Somewhat difficult      Most recent fall risk assessment:    08/21/2022    1:40 PM  Fall Risk   Falls in the past year? 0     Most recent depression screenings:    08/21/2022    1:42 PM 08/04/2022   12:58 PM  PHQ 2/9 Scores  PHQ - 2 Score 0 0  PHQ- 9 Score 5     Vision:Within last year and Dental: No current dental problems and Receives regular dental care  History reviewed. No pertinent past medical history.    Patient Care Team: Gwenlyn Perking, FNP as PCP - General (Family Medicine)   Outpatient Medications Prior to Visit  Medication Sig   fluticasone furoate-vilanterol (BREO ELLIPTA) 100-25 MCG/ACT AEPB Inhale 1 puff into the lungs daily.   pantoprazole (PROTONIX) 40 MG tablet Take 1 tablet (40 mg total) by mouth daily. For  stomach   No facility-administered medications prior to visit.    ROS Negative unless specially indicated above in HPI.     Objective:     BP 114/68   Pulse 70   Temp (!) 97.2 F (36.2 C) (Temporal)   Ht '6\' 1"'$  (1.854 m)   Wt 237 lb 8 oz (107.7 kg)   SpO2 98%   BMI 31.33 kg/m    Physical Exam Vitals and nursing note reviewed.  Constitutional:      General: He is not in acute distress.    Appearance: He is not ill-appearing.  HENT:     Head: Normocephalic.     Right Ear: Tympanic membrane, ear canal and external ear normal.     Left Ear: Tympanic membrane, ear canal and external ear normal.     Nose: Nose normal.     Mouth/Throat:     Mouth: Mucous membranes are moist.     Pharynx: Oropharynx is clear.  Eyes:     Extraocular Movements:  Extraocular movements intact.     Conjunctiva/sclera: Conjunctivae normal.     Pupils: Pupils are equal, round, and reactive to light.  Neck:     Thyroid: No thyroid mass, thyromegaly or thyroid tenderness.     Vascular: No carotid bruit.  Cardiovascular:     Rate and Rhythm: Normal rate and regular rhythm.     Pulses: Normal pulses.     Heart sounds: Normal heart sounds. No murmur heard.    No friction rub. No gallop.  Pulmonary:     Effort: Pulmonary effort is normal.     Breath sounds: Normal breath sounds.  Abdominal:     General: Bowel sounds are normal. There is no distension.     Palpations: Abdomen is soft. There is no mass.     Tenderness: There is no abdominal tenderness. There is no guarding.  Musculoskeletal:        General: No swelling. Normal range of motion.     Cervical back: Normal range of motion and neck supple. No tenderness.  Skin:    General: Skin is warm and dry.     Capillary Refill: Capillary refill takes less than 2 seconds.     Findings: No lesion or rash.  Neurological:     General: No focal deficit present.     Mental Status: He is alert and oriented to person, place, and time.  Psychiatric:        Mood and Affect: Mood normal.        Behavior: Behavior normal.        Thought Content: Thought content normal.      No results found for any visits on 08/21/22.     Assessment & Plan:    Routine Health Maintenance and Physical Exam John [name] was seen today for annual exam.  Diagnoses and all orders for this visit:  Routine general medical examination at a health care facility Reviewed labs from 08/06/22. -     TSH; Future  Shortness of breath Evaluated by cardiology and pulmonology. Suspect related to GERD, possibly esophageal spasm. Referral to GI placed.   Gastroesophageal reflux disease, unspecified whether esophagitis present Uncontrolled. Recently started Protonix. Discussed to continue. Will refer to GI given dysphagia.  -      Ambulatory referral to Gastroenterology  Esophageal dysphagia -     Ambulatory referral to Gastroenterology -     Phosphorus; Future -     TSH; Future  Colon cancer screening -  Ambulatory referral to Gastroenterology  Decreased energy Low libido Will return for labs as below.        -     TSH -     Testosterone,Free and Total; Future  Erectile dysfunction, unspecified erectile dysfunction type -     tadalafil (CIALIS) 10 MG tablet; Take 1 tablet (10 mg total) by mouth every other day as needed for erectile dysfunction. -     Testosterone,Free and Total; Future   Immunization History  Administered Date(s) Administered   PFIZER(Purple Top)SARS-COV-2 Vaccination 03/08/2020, 03/29/2020    Health Maintenance  Topic Date Due   DTaP/Tdap/Td (1 - Tdap) Never done   Fecal DNA (Cologuard)  Never done   INFLUENZA VACCINE  10/12/2022 (Originally 02/11/2022)   Hepatitis C Screening  08/22/2023 (Originally 02/23/1994)   HIV Screening  08/22/2023 (Originally 02/24/1991)   COVID-19 Vaccine (3 - 2023-24 season) 09/07/2023 (Originally 03/14/2022)   HPV VACCINES  Aged Out    Discussed health benefits of physical activity, and encouraged him to engage in regular exercise appropriate for his age and condition.  Problem List Items Addressed This Visit       Digestive   Esophageal dysphagia   Relevant Orders   Ambulatory referral to Gastroenterology   Phosphorus   TSH   Gastroesophageal reflux disease   Relevant Orders   Ambulatory referral to Gastroenterology     Other   Shortness of breath   Other Visit Diagnoses     Routine general medical examination at a health care facility    -  Primary   Relevant Orders   TSH   Colon cancer screening       Relevant Orders   Ambulatory referral to Gastroenterology   Decreased energy       Relevant Orders   Testosterone,Free and Total   TSH   Low libido       Relevant Orders   Testosterone,Free and Total   TSH   Erectile  dysfunction, unspecified erectile dysfunction type       Relevant Medications   tadalafil (CIALIS) 10 MG tablet   Other Relevant Orders   Testosterone,Free and Total      Return in about 6 weeks (around 10/02/2022) for GERD follow up.   The patient indicates understanding of these issues and agrees with the plan.  Gwenlyn Perking, FNP

## 2022-08-21 NOTE — Patient Instructions (Signed)
Health Maintenance, Male Adopting a healthy lifestyle and getting preventive care are important in promoting health and wellness. Ask your health care provider about: The right schedule for you to have regular tests and exams. Things you can do on your own to prevent diseases and keep yourself healthy. What should I know about diet, weight, and exercise? Eat a healthy diet  Eat a diet that includes plenty of vegetables, fruits, low-fat dairy products, and lean protein. Do not eat a lot of foods that are high in solid fats, added sugars, or sodium. Maintain a healthy weight Body mass index (BMI) is a measurement that can be used to identify possible weight problems. It estimates body fat based on height and weight. Your health care provider can help determine your BMI and help you achieve or maintain a healthy weight. Get regular exercise Get regular exercise. This is one of the most important things you can do for your health. Most adults should: Exercise for at least 150 minutes each week. The exercise should increase your heart rate and make you sweat (moderate-intensity exercise). Do strengthening exercises at least twice a week. This is in addition to the moderate-intensity exercise. Spend less time sitting. Even light physical activity can be beneficial. Watch cholesterol and blood lipids Have your blood tested for lipids and cholesterol at 47 years of age, then have this test every 5 years. You may need to have your cholesterol levels checked more often if: Your lipid or cholesterol levels are high. You are older than 47 years of age. You are at high risk for heart disease. What should I know about cancer screening? Many types of cancers can be detected early and may often be prevented. Depending on your health history and family history, you may need to have cancer screening at various ages. This may include screening for: Colorectal cancer. Prostate cancer. Skin cancer. Lung  cancer. What should I know about heart disease, diabetes, and high blood pressure? Blood pressure and heart disease High blood pressure causes heart disease and increases the risk of stroke. This is more likely to develop in people who have high blood pressure readings or are overweight. Talk with your health care provider about your target blood pressure readings. Have your blood pressure checked: Every 3-5 years if you are 18-39 years of age. Every year if you are 40 years old or older. If you are between the ages of 65 and 75 and are a current or former smoker, ask your health care provider if you should have a one-time screening for abdominal aortic aneurysm (AAA). Diabetes Have regular diabetes screenings. This checks your fasting blood sugar level. Have the screening done: Once every three years after age 45 if you are at a normal weight and have a low risk for diabetes. More often and at a younger age if you are overweight or have a high risk for diabetes. What should I know about preventing infection? Hepatitis B If you have a higher risk for hepatitis B, you should be screened for this virus. Talk with your health care provider to find out if you are at risk for hepatitis B infection. Hepatitis C Blood testing is recommended for: Everyone born from 1945 through 1965. Anyone with known risk factors for hepatitis C. Sexually transmitted infections (STIs) You should be screened each year for STIs, including gonorrhea and chlamydia, if: You are sexually active and are younger than 47 years of age. You are older than 47 years of age and your   health care provider tells you that you are at risk for this type of infection. Your sexual activity has changed since you were last screened, and you are at increased risk for chlamydia or gonorrhea. Ask your health care provider if you are at risk. Ask your health care provider about whether you are at high risk for HIV. Your health care provider  may recommend a prescription medicine to help prevent HIV infection. If you choose to take medicine to prevent HIV, you should first get tested for HIV. You should then be tested every 3 months for as long as you are taking the medicine. Follow these instructions at home: Alcohol use Do not drink alcohol if your health care provider tells you not to drink. If you drink alcohol: Limit how much you have to 0-2 drinks a day. Know how much alcohol is in your drink. In the [place] one drink equals one 12 oz bottle of beer (355 mL), one 5 oz glass of wine (148 mL), or one 1 oz glass of hard liquor (44 mL). Lifestyle Do not use any products that contain nicotine or tobacco. These products include cigarettes, chewing tobacco, and vaping devices, such as e-cigarettes. If you need help quitting, ask your health care provider. Do not use street drugs. Do not share needles. Ask your health care provider for help if you need support or information about quitting drugs. General instructions Schedule regular health, dental, and eye exams. Stay current with your vaccines. Tell your health care provider if: You often feel depressed. You have ever been abused or do not feel safe at home. Summary Adopting a healthy lifestyle and getting preventive care are important in promoting health and wellness. Follow your health care provider's instructions about healthy diet, exercising, and getting tested or screened for diseases. Follow your health care provider's instructions on monitoring your cholesterol and blood pressure. This information is not intended to replace advice given to you by your health care provider. Make sure you discuss any questions you have with your health care provider. Document Revised: 11/19/2020 Document Reviewed: 11/19/2020 Elsevier Patient Education  2023 [employer]  

## 2022-08-22 ENCOUNTER — Other Ambulatory Visit: Payer: BC Managed Care – PPO

## 2022-08-22 DIAGNOSIS — R6882 Decreased libido: Secondary | ICD-10-CM

## 2022-08-22 DIAGNOSIS — N529 Male erectile dysfunction, unspecified: Secondary | ICD-10-CM

## 2022-08-22 DIAGNOSIS — R5383 Other fatigue: Secondary | ICD-10-CM

## 2022-08-22 DIAGNOSIS — R1319 Other dysphagia: Secondary | ICD-10-CM

## 2022-08-22 DIAGNOSIS — Z0000 Encounter for general adult medical examination without abnormal findings: Secondary | ICD-10-CM

## 2022-08-23 LAB — TESTOSTERONE,FREE AND TOTAL

## 2022-08-23 LAB — PHOSPHORUS: Phosphorus: 3 mg/dL (ref 2.8–4.1)

## 2022-09-01 LAB — TESTOSTERONE,FREE AND TOTAL: Testosterone: 389 ng/dL (ref 264–916)

## 2022-09-01 LAB — TSH: TSH: 1.64 u[IU]/mL (ref 0.450–4.500)

## 2022-09-03 ENCOUNTER — Other Ambulatory Visit: Payer: Self-pay | Admitting: Family Medicine

## 2022-09-03 DIAGNOSIS — R7989 Other specified abnormal findings of blood chemistry: Secondary | ICD-10-CM

## 2022-09-04 ENCOUNTER — Other Ambulatory Visit (HOSPITAL_COMMUNITY): Payer: BC Managed Care – PPO

## 2022-09-26 ENCOUNTER — Ambulatory Visit: Payer: BC Managed Care – PPO | Admitting: Gastroenterology

## 2022-09-26 ENCOUNTER — Encounter: Payer: Self-pay | Admitting: Gastroenterology

## 2022-09-26 VITALS — BP 118/82 | Temp 80.0°F | Ht 73.0 in | Wt 236.0 lb

## 2022-09-26 DIAGNOSIS — Z1211 Encounter for screening for malignant neoplasm of colon: Secondary | ICD-10-CM | POA: Diagnosis not present

## 2022-09-26 DIAGNOSIS — R1310 Dysphagia, unspecified: Secondary | ICD-10-CM

## 2022-09-26 DIAGNOSIS — K219 Gastro-esophageal reflux disease without esophagitis: Secondary | ICD-10-CM | POA: Diagnosis not present

## 2022-09-26 DIAGNOSIS — K5900 Constipation, unspecified: Secondary | ICD-10-CM

## 2022-09-26 DIAGNOSIS — Z1212 Encounter for screening for malignant neoplasm of rectum: Secondary | ICD-10-CM

## 2022-09-26 MED ORDER — NA SULFATE-K SULFATE-MG SULF 17.5-3.13-1.6 GM/177ML PO SOLN: 1.0000 | Freq: Once | ORAL | 0 refills | Status: AC

## 2022-09-26 NOTE — Progress Notes (Unsigned)
HPI : John [name] is a very pleasant 47 year old male with no chronic medical history who is referred to [place] by Marjorie Smolder, FNP for further evaluation of dysphagia, GERD symptoms and colon cancer screening.  The patient states that he has had GERD symptoms for several years now.  He used to take TUMs as needed as this seemed to help, but in the past year his symptoms became more frequent until he was having symptoms every night.  Symptoms consist of burning pain in the chest and throat, worse at night and in supine position.  Although he would have daily symptoms, they were worse with certain foods such as pizza and [place] cuisine.   In addition to heartburn, he would have acid regurgitation, which was rare, but would be severe when it occurred. He has noted that coffee also makes his symptoms worse, so he does not drink it regularly.  He is a nonsmoker and rarely drinks alcohol.  He uses an adjustable bed to elevate the head of his bed at night.  A few months ago, he started having solid dysphagia.  This is described as the sensation of food sitting in his chest and taking a long time to go down.  This is noticed about once a week and is noticed more with meats.  No episodes of having to vomit stuck food.  He does not typically wash food down with water; he typically just waits for the sensation to pass before continuing to eat. No weight loss.  No liquid dysphagia.  No nausea/vomiting.  No hoarseness/chronic cough or globus sensation.  He started taking Protonix earlier this year and it has helped quite a bit with his heartburn.  No changes in his dysphagia thus far.  He has never undergone colon cancer screening.  He has no family history of GI malignancy.  He does have chronic constipation, typically only having a bowel movement 1-2x/week.  He denies hard stools or straining with defecation.  No abdominal pain, but he does feel bloated and full when he has gone several days without a bowel  movement.  Diarrhea is not a problems for him.  No blood in the stool. He tried taking daily Metamucil for a while but did not experience any improvement in his symptoms.  Otherwise he has not tried taking any medications for his constipation..  No past medical history on file.  Past Surgical History:  Procedure Laterality Date   HERNIA REPAIR     Family History  Problem Relation Age of Onset   Breast cancer Maternal Grandmother    Breast cancer Paternal Grandmother    Liver disease Neg Hx    Esophageal cancer Neg Hx    Colon cancer Neg Hx    Social History   Tobacco Use   Smoking status: Never   Smokeless tobacco: Never  Substance Use Topics   Alcohol use: Not Currently   Drug use: Not Currently   Current Outpatient Medications  Medication Sig Dispense Refill   pantoprazole (PROTONIX) 40 MG tablet Take 1 tablet (40 mg total) by mouth daily. For stomach 30 tablet 11   tadalafil (CIALIS) 10 MG tablet Take 1 tablet (10 mg total) by mouth every other day as needed for erectile dysfunction. 10 tablet 1   No current facility-administered medications for this visit.   No Known Allergies   Review of Systems: All systems reviewed and negative except where noted in HPI.    No results found.  Physical Exam:  BP 118/82   Temp (!) 80 F (26.7 C)   Ht 6\' 1"  (1.854 m)   Wt 236 lb (107 kg)   SpO2 96%   BMI 31.14 kg/m   Recorded body temperature was entered incorrectly Constitutional: Pleasant,well-developed, Caucasian male in no acute distress. HEENT: Normocephalic and atraumatic. Conjunctivae are normal. No scleral icterus. Neck supple.  Cardiovascular: Normal rate, regular rhythm.  Pulmonary/chest: Effort normal and breath sounds normal. No wheezing, rales or rhonchi. Abdominal: Soft, nondistended, nontender. Bowel sounds active throughout. There are no masses palpable. No hepatomegaly. Extremities: no edema Neurological: Alert and oriented to person place and time. Skin:  Skin is warm and dry. No rashes noted. Psychiatric: Normal mood and affect. Behavior is normal.  CBC    Component Value Date/Time   WBC 4.4 08/06/2022 0952   RBC 5.32 08/06/2022 0952   HGB 15.0 08/06/2022 0952   HCT 44.3 08/06/2022 0952   PLT 223 08/06/2022 0952   MCV 83 08/06/2022 0952   MCH 28.2 08/06/2022 0952   MCHC 33.9 08/06/2022 0952   RDW 12.7 08/06/2022 0952   LYMPHSABS 1.8 08/06/2022 0952   EOSABS 0.1 08/06/2022 0952   BASOSABS 0.1 08/06/2022 0952    CMP     Component Value Date/Time   NA 141 08/06/2022 0952   K 4.2 08/06/2022 0952   CL 105 08/06/2022 0952   CO2 23 08/06/2022 0952   GLUCOSE 92 08/06/2022 0952   BUN 12 08/06/2022 0952   CREATININE 1.34 (H) 08/06/2022 0952   CALCIUM 9.5 08/06/2022 0952   PROT 7.1 08/06/2022 0952   ALBUMIN 4.2 08/06/2022 0952   AST 22 08/06/2022 0952   ALT 21 08/06/2022 0952   ALKPHOS 85 08/06/2022 0952   BILITOT 0.5 08/06/2022 0952     ASSESSMENT AND PLAN: 47 year old male with chronic frequent typical GERD symptoms, improved with once daily Protonix and recent development of solid dysphagia.  Dysphagia most likely secondary to Schatzki ring or peptic stricture.  Will plan for EGD with dilation. We discussed the pathophysiology of GERD and the principles of GERD management to include lifestyle modifications  such as dietary discretion (avoidance of alcohol, tobacco, caffeinated and carbonated beverages, spicy/greasy foods, citrus, peppermint/chocolate), weight loss if applicable, head of bed elevation andconsuming last meal of day within 3 hours of bedtime; pharmacologic options to include PPIs, H2RAs and OTC antacids; and finally surgical or endoscopic fundoplication. For his constipation, I recommended he start taking MiraLax everyday.  He can increase this as needed to achieve a bowel movement every 1-2 days. Will schedule patient for initial average risk screening colonoscopy as well.  Dysphagia - EGD  GERD - Continue  once daily Protonix - Lifestyle modifications as discussed  CRC screening - Colonoscopy  Constipation - Start MiraLax daily  The details, risks (including bleeding, perforation, infection, missed lesions, medication reactions and possible hospitalization or surgery if complications occur), benefits, and alternatives to EGD/colonoscopy with possible biopsy, dilation and polypectomy were discussed with the patient and he consents to proceed.   Kiylah Loyer E. Candis Schatz, MD [facility]   Gwenlyn Perking, FNP

## 2022-09-26 NOTE — Patient Instructions (Addendum)
_______________________________________________________  If your blood pressure at your visit was 140/90 or greater, please contact your primary care physician to follow up on this.  _______________________________________________________  If you are age 47 or older, your body mass index should be between 23-30. Your Body mass index is 31.14 kg/m. If this is out of the aforementioned range listed, please consider follow up with your Primary Care Provider.  If you are age 80 or younger, your body mass index should be between 19-25. Your Body mass index is 31.14 kg/m. If this is out of the aformentioned range listed, please consider follow up with your Primary Care Provider.   You have been scheduled for a colonoscopy. Please follow written instructions given to you at your visit today.  Please pick up your prep supplies at the pharmacy within the next 1-3 days. If you use inhalers (even only as needed), please bring them with you on the day of your procedure.  Start Miralax daily.  ________________________________________________________  The [facility] providers would like to encourage you to use [facility] to communicate with providers for non-urgent requests or questions.  Due to long hold times on the telephone, sending your provider a message by [facility] may be a faster and more efficient way to get a response.  Please allow 48 business hours for a response.  Please remember that this is for non-urgent requests.  _____________________________________________________ It was a pleasure to see you today!  Thank you for trusting me with your gastrointestinal care!    Scott E.Candis Schatz, MD

## 2022-10-03 ENCOUNTER — Ambulatory Visit (HOSPITAL_COMMUNITY): Payer: BC Managed Care – PPO | Attending: Cardiovascular Disease

## 2022-10-03 DIAGNOSIS — R002 Palpitations: Secondary | ICD-10-CM | POA: Insufficient documentation

## 2022-10-03 DIAGNOSIS — R0602 Shortness of breath: Secondary | ICD-10-CM | POA: Diagnosis present

## 2022-10-03 LAB — ECHOCARDIOGRAM COMPLETE
Area-P 1/2: 3.08 cm2
MV M vel: 3.67 m/s
MV Peak grad: 53.9 mmHg
S' Lateral: 2.5 cm

## 2022-10-17 ENCOUNTER — Telehealth: Payer: Self-pay | Admitting: Internal Medicine

## 2022-10-17 DIAGNOSIS — Z01810 Encounter for preprocedural cardiovascular examination: Secondary | ICD-10-CM

## 2022-10-17 DIAGNOSIS — I340 Nonrheumatic mitral (valve) insufficiency: Secondary | ICD-10-CM

## 2022-10-17 DIAGNOSIS — R0602 Shortness of breath: Secondary | ICD-10-CM

## 2022-10-17 NOTE — Telephone Encounter (Signed)
Patient is returning call.  °

## 2022-10-17 NOTE — Telephone Encounter (Signed)
Patient stated he was returning a phone call. He saw his echo test results on his my chart but did not understand the result and does the patient need TEE? Will forward to MD and nurse.

## 2022-10-21 NOTE — Telephone Encounter (Signed)
Voicemail still full.

## 2022-10-21 NOTE — Telephone Encounter (Signed)
Unable to leave a voice message... mailbox full.   Per Dr Tenny Craw:   I reached patient       Reviewed echo with him   He is still getting SOB with hills  REcomm  TEE to further evaluate MV, mechanism of MR and severity  Please call me for scheduling

## 2022-10-22 NOTE — Telephone Encounter (Signed)
I spoke with the pt and he says he is working to get his voicemail fixed.. he agrees to have me schedule his TEE he is unsure about a date but will let me know if the next available does not work.   After calling central sched... I made the TEE for 10/30/22 with Dr Servando Salina... arrival at 7:30 am.   I was not able to get back in touch with the pt.   I will send a My chart as we both discussed at my initial call and he will let me know if the date does not work.

## 2022-10-30 NOTE — Telephone Encounter (Signed)
Called to talk with the pt about his TEE date change and he said something was wrong with his phone and said he will call me back.

## 2022-11-10 ENCOUNTER — Encounter: Payer: Self-pay | Admitting: Gastroenterology

## 2022-11-12 NOTE — Progress Notes (Signed)
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

## 2022-11-13 ENCOUNTER — Ambulatory Visit (HOSPITAL_COMMUNITY)
Admission: RE | Admit: 2022-11-13 | Payer: BC Managed Care – PPO | Source: Home / Self Care | Admitting: Cardiovascular Disease

## 2022-11-13 ENCOUNTER — Encounter (HOSPITAL_COMMUNITY): Admission: RE | Payer: Self-pay | Source: Home / Self Care

## 2022-11-13 SURGERY — ECHOCARDIOGRAM, TRANSESOPHAGEAL
Anesthesia: Monitor Anesthesia Care

## 2022-11-18 ENCOUNTER — Encounter: Payer: Self-pay | Admitting: Gastroenterology

## 2022-11-18 ENCOUNTER — Ambulatory Visit (AMBULATORY_SURGERY_CENTER): Payer: BC Managed Care – PPO | Admitting: Gastroenterology

## 2022-11-18 VITALS — BP 105/63 | HR 52 | Temp 98.6°F | Resp 11 | Ht 73.0 in | Wt 236.0 lb

## 2022-11-18 DIAGNOSIS — D123 Benign neoplasm of transverse colon: Secondary | ICD-10-CM

## 2022-11-18 DIAGNOSIS — R1310 Dysphagia, unspecified: Secondary | ICD-10-CM | POA: Diagnosis not present

## 2022-11-18 DIAGNOSIS — Z1211 Encounter for screening for malignant neoplasm of colon: Secondary | ICD-10-CM

## 2022-11-18 MED ORDER — ESOMEPRAZOLE MAGNESIUM 40 MG PO CPDR: 40.0000 mg | DELAYED_RELEASE_CAPSULE | Freq: Two times a day (BID) | ORAL | 1 refills | Status: DC

## 2022-11-18 MED ORDER — SODIUM CHLORIDE 0.9 % IV SOLN
500.0000 mL | Freq: Once | INTRAVENOUS | Status: DC
Start: 2022-11-18 — End: 2023-01-16

## 2022-11-18 NOTE — Progress Notes (Signed)
Uneventful anesthetic. Report to pacu rn. Vss. Care resumed by rn. 

## 2022-11-18 NOTE — Progress Notes (Signed)
Called to room to assist during endoscopic procedure.  Patient ID and intended procedure confirmed with present staff. Received instructions for my participation in the procedure from the performing physician.  

## 2022-11-18 NOTE — Op Note (Signed)
[facility] Patient Name: John [name] Procedure Date: 11/18/2022 11:25 AM MRN: [id number] Endoscopist: Lorin Picket E. Tomasa Rand , MD, [id number] Age: 47 Referring MD:  Date of Birth: 1976/01/13 Gender: Male Account #: [id number] Procedure:                Colonoscopy Indications:              Screening for colorectal malignant neoplasm, This                            is the patient's first colonoscopy Medicines:                Monitored Anesthesia Care Procedure:                Pre-Anesthesia Assessment:                           - Prior to the procedure, a History and Physical                            was performed, and patient medications and                            allergies were reviewed. The patient's tolerance of                            previous anesthesia was also reviewed. The risks                            and benefits of the procedure and the sedation                            options and risks were discussed with the patient.                            All questions were answered, and informed consent                            was obtained. Prior Anticoagulants: The patient has                            taken no anticoagulant or antiplatelet agents. ASA                            Grade Assessment: II - A patient with mild systemic                            disease. After reviewing the risks and benefits,                            the patient was deemed in satisfactory condition to                            undergo the procedure.  After obtaining informed consent, the colonoscope                            was passed under direct vision. Throughout the                            procedure, the patient's blood pressure, pulse, and                            oxygen saturations were monitored continuously. The                            Olympus CF-HQ190L SN [id number] was introduced through                            the anus and  advanced to the the terminal ileum,                            with identification of the appendiceal orifice and                            IC valve. The colonoscopy was performed without                            difficulty. The patient tolerated the procedure                            well. The quality of the bowel preparation was                            good. The terminal ileum, ileocecal valve,                            appendiceal orifice, and rectum were photographed.                            The bowel preparation used was SUPREP via split                            dose instruction. Scope In: 11:48:23 AM Scope Out: 12:06:08 PM Scope Withdrawal Time: 0 hours 11 minutes 48 seconds  Total Procedure Duration: 0 hours 17 minutes 45 seconds  Findings:                 The perianal and digital rectal examinations were                            normal. Pertinent negatives include normal                            sphincter tone and no palpable rectal lesions.                           Two sessile polyps were found in the transverse  colon. The polyps were 3 to 4 mm in size. These                            polyps were removed with a cold snare. Resection                            and retrieval were complete. Estimated blood loss                            was minimal.                           The exam was otherwise normal throughout the                            examined colon.                           The terminal ileum appeared normal.                           The retroflexed view of the distal rectum and anal                            verge was normal and showed no anal or rectal                            abnormalities. Complications:            No immediate complications. Estimated Blood Loss:     Estimated blood loss was minimal. Impression:               - Two 3 to 4 mm polyps in the transverse colon,                            removed with  a cold snare. Resected and retrieved.                           - The examined portion of the ileum was normal.                           - The distal rectum and anal verge are normal on                            retroflexion view.                           - The GI Genius (intelligent endoscopy module),                            computer-aided polyp detection system powered by AI                            was utilized to detect colorectal polyps through  enhanced visualization during colonoscopy. Recommendation:           - Patient has a contact number available for                            emergencies. The signs and symptoms of potential                            delayed complications were discussed with the                            patient. Return to normal activities tomorrow.                            Written discharge instructions were provided to the                            patient.                           - Resume previous diet.                           - Continue present medications.                           - Await pathology results.                           - Repeat colonoscopy (date not yet determined) for                            surveillance based on pathology results. Buffey Zabinski E. Tomasa Rand, MD 11/18/2022 12:16:23 PM This report has been signed electronically.

## 2022-11-18 NOTE — Progress Notes (Signed)
[facility] History and Physical   Primary Care Physician:  Gabriel Earing, FNP   Reason for Procedure:   Dysphagia, colon cancer screening  Plan:    EGD, colonoscopy     HPI: John [name] is a 47 y.o. male undergoing initial average risk screening colonoscopy.  He has no family history of colon cancer and no chronic lower GI symptoms other than constipation.  He has chronic typical GERD symptoms which are well controlled with Protonix and recent solid dysphagia without weight loss.    History reviewed. No pertinent past medical history.  Past Surgical History:  Procedure Laterality Date   HERNIA REPAIR      Prior to Admission medications   Medication Sig Start Date End Date Taking? Authorizing Provider  NONFORMULARY OR COMPOUNDED ITEM Apply 1 Application topically daily. HQ4%/TRET.05%/TAC.1%CR APPLY TO THE AFFECTED AREA(S) DAILY FOR 8 WEEKS AS DIRECTED. WEAR SPF! 09/29/22  Yes [provider]  pantoprazole (PROTONIX) 40 MG tablet Take 1 tablet (40 mg total) by mouth daily. For stomach 08/04/22  Yes Stacks, Broadus John, MD  tadalafil (CIALIS) 10 MG tablet Take 1 tablet (10 mg total) by mouth every other day as needed for erectile dysfunction. 08/21/22   Gabriel Earing, FNP    Current Outpatient Medications  Medication Sig Dispense Refill   NONFORMULARY OR COMPOUNDED ITEM Apply 1 Application topically daily. HQ4%/TRET.05%/TAC.1%CR APPLY TO THE AFFECTED AREA(S) DAILY FOR 8 WEEKS AS DIRECTED. WEAR SPF!     pantoprazole (PROTONIX) 40 MG tablet Take 1 tablet (40 mg total) by mouth daily. For stomach 30 tablet 11   tadalafil (CIALIS) 10 MG tablet Take 1 tablet (10 mg total) by mouth every other day as needed for erectile dysfunction. 10 tablet 1   Current Facility-Administered Medications  Medication Dose Route Frequency Provider Last Rate Last Admin   0.9 %  sodium chloride infusion  500 mL Intravenous Once Jenel Lucks, MD        Allergies as of  11/18/2022   (No Known Allergies)    Family History  Problem Relation Age of Onset   Breast cancer Maternal Grandmother    Breast cancer Paternal Grandmother    Liver disease Neg Hx    Esophageal cancer Neg Hx    Colon cancer Neg Hx     Social History   Socioeconomic History   Marital status: Married    Spouse name: Not on file   Number of children: 2   Years of education: Not on file   Highest education level: Not on file  Occupational History   Occupation: Patent examiner  Tobacco Use   Smoking status: Never   Smokeless tobacco: Never  Vaping Use   Vaping Use: Never used  Substance and Sexual Activity   Alcohol use: Not Currently   Drug use: Not Currently   Sexual activity: Not Currently  Other Topics Concern   Not on file  Social History Narrative   Not on file   Social Determinants of Health   Financial Resource Strain: Not on file  Food Insecurity: Not on file  Transportation Needs: Not on file  Physical Activity: Not on file  Stress: Not on file  Social Connections: Not on file  Intimate Partner Violence: Not on file    Review of Systems:  All other review of systems negative except as mentioned in the HPI.  Physical Exam: Vital signs BP 131/84   Pulse 68   Temp 98.6 F (37 C)   Resp 16  Ht 6\' 1"  (1.854 m)   Wt 236 lb (107 kg)   SpO2 99%   BMI 31.14 kg/m   General:   Alert,  Well-developed, well-nourished, pleasant and cooperative in NAD Airway:  Mallampati 1 Lungs:  Clear throughout to auscultation.   Heart:  Regular rate and rhythm; no murmurs, clicks, rubs,  or gallops. Abdomen:  Soft, nontender and nondistended. Normal bowel sounds.   Neuro/Psych:  Normal mood and affect. A and O x 3   Nykole Matos E. Tomasa Rand, MD [facility]

## 2022-11-18 NOTE — Patient Instructions (Addendum)
Endoscopy:   Handouts on Esophagitis And Reflux and hiatal hernia given to you today  Discontinue Pantoprazole, start Nexium 40 mg twice a day for 8 weeks ( # 120 with one refill order sent to your pharmacy)  Repeat Endoscopy in 8 weeks - appointment and instructions given to you by Belinda Block   Colonoscopy:  Handout on polyps given to you today  Await pathology results on polyps removed    YOU HAD AN ENDOSCOPIC PROCEDURE TODAY AT THE [facility]:   Refer to the procedure report that was given to you for any specific questions about what was found during the examination.  If the procedure report does not answer your questions, please call your gastroenterologist to clarify.  If you requested that your care partner not be given the details of your procedure findings, then the procedure report has been included in a sealed envelope for you to review at your convenience later.  YOU SHOULD EXPECT: Some feelings of bloating in the abdomen. Passage of more gas than usual.  Walking can help get rid of the air that was put into your GI tract during the procedure and reduce the bloating. If you had a lower endoscopy (such as a colonoscopy or flexible sigmoidoscopy) you may notice spotting of blood in your stool or on the toilet paper. If you underwent a bowel prep for your procedure, you may not have a normal bowel movement for a few days.  Please Note:  You might notice some irritation and congestion in your nose or some drainage.  This is from the oxygen used during your procedure.  There is no need for concern and it should clear up in a day or so.  SYMPTOMS TO REPORT IMMEDIATELY:  Following lower endoscopy (colonoscopy or flexible sigmoidoscopy):  Excessive amounts of blood in the stool  Significant tenderness or worsening of abdominal pains  Swelling of the abdomen that is new, acute  Fever of 100F or higher  Following upper endoscopy (EGD)  Vomiting of blood or  coffee ground material  New chest pain or pain under the shoulder blades  Painful or persistently difficult swallowing  New shortness of breath  Fever of 100F or higher  Black, tarry-looking stools  For urgent or emergent issues, a gastroenterologist can be reached at any hour by calling [phone]. Do not use MyChart messaging for urgent concerns.    DIET:  We do recommend a small meal at first, but then you may proceed to your regular diet.  Drink plenty of fluids but you should avoid alcoholic beverages for 24 hours.  ACTIVITY:  You should plan to take it easy for the rest of today and you should NOT DRIVE or use heavy machinery until tomorrow (because of the sedation medicines used during the test).    FOLLOW UP: Our staff will call the number listed on your records the next business day following your procedure.  We will call around 7:15- 8:00 am to check on you and address any questions or concerns that you may have regarding the information given to you following your procedure. If we do not reach you, we will leave a message.     If any biopsies were taken you will be contacted by phone or by letter within the next 1-3 weeks.  Please call us at [phone] if you have not heard about the biopsies in 3 weeks.    SIGNATURES/CONFIDENTIALITY: You and/or your care partner have signed paperwork which will  be entered into your electronic medical record.  These signatures attest to the fact that that the information above on your After Visit Summary has been reviewed and is understood.  Full responsibility of the confidentiality of this discharge information lies with you and/or your care-partner.

## 2022-11-18 NOTE — Op Note (Signed)
[facility] Patient Name: John [name] Procedure Date: 11/18/2022 11:32 AM MRN: [id number] Endoscopist: Lorin Picket E. Tomasa Rand , MD, [id number] Age: 47 Referring MD:  Date of Birth: April 20, 1976 Gender: Male Account #: [id number] Procedure:                Upper GI endoscopy Indications:              Dysphagia, symptoms of reflux currently controlled                            with once daily Protonix Medicines:                Monitored Anesthesia Care Procedure:                Pre-Anesthesia Assessment:                           - Prior to the procedure, a History and Physical                            was performed, and patient medications and                            allergies were reviewed. The patient's tolerance of                            previous anesthesia was also reviewed. The risks                            and benefits of the procedure and the sedation                            options and risks were discussed with the patient.                            All questions were answered, and informed consent                            was obtained. Prior Anticoagulants: The patient has                            taken no anticoagulant or antiplatelet agents. ASA                            Grade Assessment: II - A patient with mild systemic                            disease. After reviewing the risks and benefits,                            the patient was deemed in satisfactory condition to                            undergo the procedure.  After obtaining informed consent, the endoscope was                            passed under direct vision. Throughout the                            procedure, the patient's blood pressure, pulse, and                            oxygen saturations were monitored continuously. The                            Olympus Scope [id number] was introduced through the                            mouth, and advanced to  the second part of duodenum.                            The upper GI endoscopy was accomplished without                            difficulty. The patient tolerated the procedure                            well. Scope In: Scope Out: Findings:                 LA Grade D (one or more mucosal breaks involving at                            least 75% of esophageal circumference) esophagitis                            with no bleeding was found.                           One benign-appearing, intrinsic mild stenosis was                            found. This stenosis measured 1.2 cm (inner                            diameter) x 2 cm (in length). The stenosis was                            traversed.                           The exam of the esophagus was otherwise normal.                           A 6 cm hiatal hernia was present.                           The exam of the stomach was otherwise normal.  The examined duodenum was normal. Complications:            No immediate complications. Estimated Blood Loss:     Estimated blood loss: none. Impression:               - LA Grade D reflux esophagitis with no bleeding.                           - Benign-appearing esophageal stenosis. Not dilated                            due to significant inflammation                           - 6 cm hiatal hernia.                           - Normal examined duodenum.                           - No specimens collected. Recommendation:           - Patient has a contact number available for                            emergencies. The signs and symptoms of potential                            delayed complications were discussed with the                            patient. Return to normal activities tomorrow.                            Written discharge instructions were provided to the                            patient.                           - Resume previous diet.                            - Continue present medications.                           - Repeat upper endoscopy in 8 weeks to check                            healing.                           - Will discuss changing PPI vs increasing Protonix                            to twice daily Devani Odonnel E. Tomasa Rand, MD 11/18/2022 12:13:00 PM This report has been signed electronically.

## 2022-11-19 ENCOUNTER — Telehealth: Payer: Self-pay

## 2022-11-19 NOTE — Telephone Encounter (Signed)
  Follow up Call-     11/18/2022   10:35 AM  Call back number  Post procedure Call Back phone  # [phone]  Permission to leave phone message Yes     Patient questions:  Do you have a fever, pain , or abdominal swelling? No. Pain Score  0 *  Have you tolerated food without any problems? Yes.    Have you been able to return to your normal activities? Yes.    Do you have any questions about your discharge instructions: Diet   No. Medications  No. Follow up visit  No.  Do you have questions or concerns about your Care? No.  Actions: * If pain score is 4 or above: No action needed, pain <4.

## 2022-11-20 ENCOUNTER — Telehealth: Payer: Self-pay

## 2022-11-20 NOTE — Telephone Encounter (Signed)
  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 Pt did not call our office to cancel and noted he had an EGD and Colonoscopy on 5.7.24. Will forward to Dr Tenny Craw.

## 2022-11-21 NOTE — Telephone Encounter (Signed)
From Dr Tenny Craw:   If he does not call to reschedule, place him on list for call back/clnic visit in late early Sept   I can add him in/overbook.   Not sure how else to keep as reminder   Will call and follow up with the pt.

## 2022-11-24 NOTE — Progress Notes (Signed)
Mr. Nault, One polyp which I removed during your recent procedure was proven to be completely benign but is considered a "pre-cancerous" polyp that MAY have grown into cancer if it had not been removed.  The other polyp was not precancerous.  Studies shows that at least 20% of women over age 47 and 30% of men over age 69 have pre-cancerous polyps.  Based on current nationally recognized surveillance guidelines, I recommend that you have a repeat colonoscopy in 7 years.   As discussed, you still need to repeat upper endoscopy in 8 weeks to check healing of your esophagitis.  If you develop any new rectal bleeding, abdominal pain or significant bowel habit changes, please contact me before then.

## 2022-11-25 NOTE — Telephone Encounter (Signed)
Unable to LM for the pt... recall placed for the pt to be seen in Sept 2024.

## 2022-12-31 ENCOUNTER — Encounter: Payer: Self-pay | Admitting: Gastroenterology

## 2023-01-16 ENCOUNTER — Ambulatory Visit (AMBULATORY_SURGERY_CENTER): Payer: BC Managed Care – PPO | Admitting: Gastroenterology

## 2023-01-16 ENCOUNTER — Encounter: Payer: Self-pay | Admitting: Gastroenterology

## 2023-01-16 VITALS — BP 109/63 | HR 68 | Temp 96.6°F | Resp 21 | Ht 72.5 in | Wt 236.6 lb

## 2023-01-16 DIAGNOSIS — K2100 Gastro-esophageal reflux disease with esophagitis, without bleeding: Secondary | ICD-10-CM

## 2023-01-16 DIAGNOSIS — K222 Esophageal obstruction: Secondary | ICD-10-CM

## 2023-01-16 DIAGNOSIS — K3189 Other diseases of stomach and duodenum: Secondary | ICD-10-CM | POA: Diagnosis not present

## 2023-01-16 DIAGNOSIS — K209 Esophagitis, unspecified without bleeding: Secondary | ICD-10-CM

## 2023-01-16 DIAGNOSIS — K219 Gastro-esophageal reflux disease without esophagitis: Secondary | ICD-10-CM

## 2023-01-16 DIAGNOSIS — K317 Polyp of stomach and duodenum: Secondary | ICD-10-CM | POA: Diagnosis not present

## 2023-01-16 MED ORDER — ESOMEPRAZOLE MAGNESIUM 40 MG PO CPDR: 40.0000 mg | DELAYED_RELEASE_CAPSULE | Freq: Every day | ORAL | 3 refills | Status: DC

## 2023-01-16 MED ORDER — SODIUM CHLORIDE 0.9 % IV SOLN
500.00 mL | Freq: Once | INTRAVENOUS | Status: DC
Start: 2023-01-16 — End: 2023-01-16

## 2023-01-16 NOTE — Progress Notes (Signed)
Vss nad trans to pacu 

## 2023-01-16 NOTE — Progress Notes (Signed)
[facility] History and Physical   Primary Care Physician:  Gabriel Earing, FNP   Reason for Procedure:   Follow up esophagitis  Plan:    EGD     HPI: John [name] is a 47 y.o. male undergoing repeat EGD to assess reflux esophagitis.  He underwent an EGD in May with LA Grade D esophagitis, large hiatal hernia and low grade esophageal stricture. His symptoms have been well controlled on daily Nexium until this past week when he had a bad night after eating [place] food and has had persistent throat irritation since.    History reviewed. No pertinent past medical history.  Past Surgical History:  Procedure Laterality Date   HERNIA REPAIR      Prior to Admission medications   Medication Sig Start Date End Date Taking? Authorizing Provider  esomeprazole (NEXIUM) 40 MG capsule Take 1 capsule (40 mg total) by mouth 2 (two) times daily. 11/18/22  Yes Jenel Lucks, MD  NONFORMULARY OR COMPOUNDED ITEM Apply 1 Application topically daily. HQ4%/TRET.05%/TAC.1%CR APPLY TO THE AFFECTED AREA(S) DAILY FOR 8 WEEKS AS DIRECTED. WEAR SPF! 09/29/22   [provider]  tadalafil (CIALIS) 10 MG tablet Take 1 tablet (10 mg total) by mouth every other day as needed for erectile dysfunction. 08/21/22   Gabriel Earing, FNP    Current Outpatient Medications  Medication Sig Dispense Refill   esomeprazole (NEXIUM) 40 MG capsule Take 1 capsule (40 mg total) by mouth 2 (two) times daily. 120 capsule 1   NONFORMULARY OR COMPOUNDED ITEM Apply 1 Application topically daily. HQ4%/TRET.05%/TAC.1%CR APPLY TO THE AFFECTED AREA(S) DAILY FOR 8 WEEKS AS DIRECTED. WEAR SPF!     tadalafil (CIALIS) 10 MG tablet Take 1 tablet (10 mg total) by mouth every other day as needed for erectile dysfunction. 10 tablet 1   Current Facility-Administered Medications  Medication Dose Route Frequency Provider Last Rate Last Admin   0.9 %  sodium chloride infusion  500 mL Intravenous Once Tiajuana Amass E, MD       0.9 %  sodium chloride infusion  500 mL Intravenous Once Cirigliano, Vito V, DO        Allergies as of 01/16/2023   (No Known Allergies)    Family History  Problem Relation Age of Onset   Breast cancer Maternal Grandmother    Breast cancer Paternal Grandmother    Liver disease Neg Hx    Esophageal cancer Neg Hx    Colon cancer Neg Hx     Social History   Socioeconomic History   Marital status: Married    Spouse name: Not on file   Number of children: 2   Years of education: Not on file   Highest education level: Not on file  Occupational History   Occupation: Patent examiner  Tobacco Use   Smoking status: Never   Smokeless tobacco: Never  Vaping Use   Vaping Use: Never used  Substance and Sexual Activity   Alcohol use: Not Currently   Drug use: Not Currently   Sexual activity: Not Currently  Other Topics Concern   Not on file  Social History Narrative   Not on file   Social Determinants of Health   Financial Resource Strain: Not on file  Food Insecurity: Not on file  Transportation Needs: Not on file  Physical Activity: Not on file  Stress: Not on file  Social Connections: Not on file  Intimate Partner Violence: Not on file    Review of Systems:  All other review of systems negative except as mentioned in the HPI.  Physical Exam: Vital signs BP 115/73   Pulse 78   Temp (!) 96.6 F (35.9 C)   Ht 6' 0.5" (1.842 m)   Wt 236 lb 9.6 oz (107.3 kg)   SpO2 96%   BMI 31.65 kg/m   General:   Alert,  Well-developed, well-nourished, pleasant and cooperative in NAD Airway:  Mallampati 2 Lungs:  Clear throughout to auscultation.   Heart:  Regular rate and rhythm; no murmurs, clicks, rubs,  or gallops. Abdomen:  Soft, nontender and nondistended. Normal bowel sounds.   Neuro/Psych:  Normal mood and affect. A and O x 3   Ovide Dusek E. Tomasa Rand, MD [facility]

## 2023-01-16 NOTE — Patient Instructions (Signed)
Resume previous diet and medications. Continue Nexium indefinitely. Awaiting pathology results.   YOU HAD AN ENDOSCOPIC PROCEDURE TODAY AT THE [facility]:   Refer to the procedure report that was given to you for any specific questions about what was found during the examination.  If the procedure report does not answer your questions, please call your gastroenterologist to clarify.  If you requested that your care partner not be given the details of your procedure findings, then the procedure report has been included in a sealed envelope for you to review at your convenience later.  YOU SHOULD EXPECT: Some feelings of bloating in the abdomen. Passage of more gas than usual.  Walking can help get rid of the air that was put into your GI tract during the procedure and reduce the bloating. If you had a lower endoscopy (such as a colonoscopy or flexible sigmoidoscopy) you may notice spotting of blood in your stool or on the toilet paper. If you underwent a bowel prep for your procedure, you may not have a normal bowel movement for a few days.  Please Note:  You might notice some irritation and congestion in your nose or some drainage.  This is from the oxygen used during your procedure.  There is no need for concern and it should clear up in a day or so.  SYMPTOMS TO REPORT IMMEDIATELY:  Following upper endoscopy (EGD)  Vomiting of blood or coffee ground material  New chest pain or pain under the shoulder blades  Painful or persistently difficult swallowing  New shortness of breath  Fever of 100F or higher  Black, tarry-looking stools  For urgent or emergent issues, a gastroenterologist can be reached at any hour by calling [phone]. Do not use MyChart messaging for urgent concerns.    DIET:  We do recommend a small meal at first, but then you may proceed to your regular diet.  Drink plenty of fluids but you should avoid alcoholic beverages for 24 hours.  ACTIVITY:  You  should plan to take it easy for the rest of today and you should NOT DRIVE or use heavy machinery until tomorrow (because of the sedation medicines used during the test).    FOLLOW UP: Our staff will call the number listed on your records the next business day following your procedure.  We will call around 7:15- 8:00 am to check on you and address any questions or concerns that you may have regarding the information given to you following your procedure. If we do not reach you, we will leave a message.     If any biopsies were taken you will be contacted by phone or by letter within the next 1-3 weeks.  Please call us at [phone] if you have not heard about the biopsies in 3 weeks.    SIGNATURES/CONFIDENTIALITY: You and/or your care partner have signed paperwork which will be entered into your electronic medical record.  These signatures attest to the fact that that the information above on your After Visit Summary has been reviewed and is understood.  Full responsibility of the confidentiality of this discharge information lies with you and/or your care-partner.

## 2023-01-16 NOTE — Op Note (Signed)
[facility] Patient Name: John [name] Procedure Date: 01/16/2023 7:21 AM MRN: [id number] Endoscopist: Lorin Picket E. Tomasa Rand , MD, [id number] Age: 47 Referring MD:  Date of Birth: 27-Nov-1975 Gender: Male Account #: [id number] Procedure:                Upper GI endoscopy Indications:              Follow-up of reflux esophagitis Medicines:                Monitored Anesthesia Care Procedure:                Pre-Anesthesia Assessment:                           - Prior to the procedure, a History and Physical                            was performed, and patient medications and                            allergies were reviewed. The patient's tolerance of                            previous anesthesia was also reviewed. The risks                            and benefits of the procedure and the sedation                            options and risks were discussed with the patient.                            All questions were answered, and informed consent                            was obtained. Prior Anticoagulants: The patient has                            taken no anticoagulant or antiplatelet agents. ASA                            Grade Assessment: II - A patient with mild systemic                            disease. After reviewing the risks and benefits,                            the patient was deemed in satisfactory condition to                            undergo the procedure.                           After obtaining informed consent, the endoscope was  passed under direct vision. Throughout the                            procedure, the patient's blood pressure, pulse, and                            oxygen saturations were monitored continuously. The                            Olympus Scope [id number] was introduced through the                            mouth, and advanced to the second part of duodenum.                            The upper GI  endoscopy was accomplished without                            difficulty. The patient tolerated the procedure                            well. Scope In: Scope Out: Findings:                 The examined portions of the nasopharynx,                            oropharynx and larynx were normal.                           One benign-appearing, intrinsic mild stenosis was                            found at the gastroesophageal junction. This                            stenosis measured 1.2 cm (inner diameter) x 1 cm                            (in length). The stenosis was traversed. The scope                            was withdrawn. Dilation was performed with a                            Maloney dilator with mild resistance at 54 Fr. The                            dilation site was examined and showed no change.                            Estimated blood loss: none.                           A 5 cm hiatal  hernia was present.                           The exam of the stomach was otherwise normal.                           Multiple 3 to 10 mm sessile polyps were found in                            the duodenal bulb. Biopsies were taken with a cold                            forceps for histology. Estimated blood loss was                            minimal.                           The exam of the duodenum was otherwise normal. Complications:            No immediate complications. Estimated Blood Loss:     Estimated blood loss was minimal. Impression:               - The examined portions of the nasopharynx,                            oropharynx and larynx were normal.                           - Benign-appearing esophageal stenosis. Dilated.                            The previously noted esophagitis has healed.                           - 5 cm hiatal hernia.                           - Multiple duodenal polyps. Biopsied. Recommendation:           - Patient has a contact number available  for                            emergencies. The signs and symptoms of potential                            delayed complications were discussed with the                            patient. Return to normal activities tomorrow.                            Written discharge instructions were provided to the                            patient.                           -  Resume previous diet.                           - Continue present medications, including Nexium                            indefinitely.                           - Await pathology results. Jamilet Ambroise E. Tomasa Rand, MD 01/16/2023 8:24:36 AM This report has been signed electronically.

## 2023-01-19 ENCOUNTER — Telehealth: Payer: Self-pay | Admitting: *Deleted

## 2023-01-19 NOTE — Telephone Encounter (Signed)
Attempted to call patient for their post-procedure follow-up call. Unable to get in touch with patient. If you have any questions or concerns following your procedure from 01/16/23 at [facility], please give [place] a call.

## 2023-01-22 NOTE — Progress Notes (Signed)
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  This is a benign finding in which gastric mucosa is found in the first portion of the duodenum.  It is not thought to cause any symptoms or increase the risk of cancer or any other condition.  No further evaluation or treatment is recommended.

## 2023-02-13 ENCOUNTER — Encounter: Payer: Self-pay | Admitting: Family Medicine

## 2023-05-27 ENCOUNTER — Encounter: Payer: Self-pay | Admitting: Family Medicine

## 2023-05-27 ENCOUNTER — Ambulatory Visit: Payer: BC Managed Care – PPO | Admitting: Family Medicine

## 2023-05-27 ENCOUNTER — Telehealth: Payer: Self-pay

## 2023-05-27 VITALS — BP 123/83 | HR 85 | Temp 98.3°F | Ht 72.5 in | Wt 243.0 lb

## 2023-05-27 DIAGNOSIS — J069 Acute upper respiratory infection, unspecified: Secondary | ICD-10-CM

## 2023-05-27 MED ORDER — DM-GUAIFENESIN ER 30-600 MG PO TB12
1.0000 | ORAL_TABLET | Freq: Two times a day (BID) | ORAL | 1 refills | Status: DC
Start: 2023-05-27 — End: 2023-10-17

## 2023-05-27 MED ORDER — FLUTICASONE PROPIONATE 50 MCG/ACT NA SUSP
1.0000 | Freq: Two times a day (BID) | NASAL | 1 refills | Status: DC | PRN
Start: 2023-05-27 — End: 2023-06-10

## 2023-05-27 NOTE — Progress Notes (Signed)
BP 123/83   Pulse 85   Temp 98.3 F (36.8 C)   Ht 6' 0.5" (1.842 m)   Wt 243 lb (110.2 kg)   SpO2 95%   BMI 32.50 kg/m    Subjective:   Patient ID: John [name], male    DOB: Sep 16, 1975, 47 y.o.   MRN: [id number]  HPI: John [name] is a 47 y.o. male presenting on 05/27/2023 for No chief complaint on file.   HPI Congestion cough Patient is coming in today with congestion and cough it has been going on over the past couple days.  He did have a grandson's been fighting RSV over the past couple weeks and has been struggling with some breathing as well and he knows that based on exposure and they had him over to his house for couple days that he feels like he is gotten it from him.  He denies any fevers but has had some chills and aches and some congestion and he has a productive cough.  He denies any shortness of breath or wheezing.  Relevant past medical, surgical, family and social history reviewed and updated as indicated. Interim medical history since our last visit reviewed. Allergies and medications reviewed and updated.  Review of Systems  Constitutional:  Positive for chills. Negative for fever.  HENT:  Positive for congestion, postnasal drip, rhinorrhea and sinus pressure. Negative for ear discharge, ear pain, sneezing, sore throat and voice change.   Eyes:  Negative for pain, discharge, redness and visual disturbance.  Respiratory:  Positive for cough. Negative for shortness of breath and wheezing.   Cardiovascular:  Negative for chest pain and leg swelling.  Musculoskeletal:  Positive for myalgias. Negative for gait problem.  Skin:  Negative for rash.  All other systems reviewed and are negative.   Per HPI unless specifically indicated above   Allergies as of 05/27/2023   No Known Allergies      Medication List        Accurate as of May 27, 2023  2:23 PM. If you have any questions, ask your nurse or doctor.          dextromethorphan-guaiFENesin  30-600 MG 12hr tablet Commonly known as: MUCINEX DM Take 1 tablet by mouth 2 (two) times daily. Started by: Elige Radon Zyria Fiscus   esomeprazole 40 MG capsule Commonly known as: NexIUM Take 1 capsule (40 mg total) by mouth daily at 12 noon.   fluticasone 50 MCG/ACT nasal spray Commonly known as: FLONASE Place 1 spray into both nostrils 2 (two) times daily as needed for allergies or rhinitis. Started by: Elige Radon Lurline Caver   NONFORMULARY OR COMPOUNDED ITEM Apply 1 Application topically daily. HQ4%/TRET.05%/TAC.1%CR APPLY TO THE AFFECTED AREA(S) DAILY FOR 8 WEEKS AS DIRECTED. WEAR SPF!   tadalafil 10 MG tablet Commonly known as: CIALIS Take 1 tablet (10 mg total) by mouth every other day as needed for erectile dysfunction.         Objective:   BP 123/83   Pulse 85   Temp 98.3 F (36.8 C)   Ht 6' 0.5" (1.842 m)   Wt 243 lb (110.2 kg)   SpO2 95%   BMI 32.50 kg/m   Wt Readings from Last 3 Encounters:  05/27/23 243 lb (110.2 kg)  01/16/23 236 lb 9.6 oz (107.3 kg)  11/18/22 236 lb (107 kg)    Physical Exam Vitals and nursing note reviewed.  Constitutional:      General: He is not in acute distress.  Appearance: He is well-developed. He is not diaphoretic.  HENT:     Right Ear: Tympanic membrane, ear canal and external ear normal.     Left Ear: Tympanic membrane, ear canal and external ear normal.     Nose: Mucosal edema and rhinorrhea present.     Right Sinus: Maxillary sinus tenderness present. No frontal sinus tenderness.     Left Sinus: Maxillary sinus tenderness present. No frontal sinus tenderness.     Mouth/Throat:     Pharynx: Uvula midline. Posterior oropharyngeal erythema present. No oropharyngeal exudate.     Tonsils: No tonsillar abscesses.  Eyes:     General: No scleral icterus.    Conjunctiva/sclera: Conjunctivae normal.  Neck:     Thyroid: No thyromegaly.  Cardiovascular:     Rate and Rhythm: Normal rate and regular rhythm.     Heart sounds:  Normal heart sounds. No murmur heard. Pulmonary:     Effort: Pulmonary effort is normal. No respiratory distress.     Breath sounds: Normal breath sounds. No wheezing, rhonchi or rales.  Musculoskeletal:        General: Normal range of motion.     Cervical back: Neck supple.  Lymphadenopathy:     Cervical: No cervical adenopathy.  Skin:    General: Skin is warm and dry.     Findings: No rash.  Neurological:     Mental Status: He is alert and oriented to person, place, and time.     Coordination: Coordination normal.  Psychiatric:        Behavior: Behavior normal.       Assessment & Plan:   Problem List Items Addressed This Visit   None Visit Diagnoses     Upper respiratory tract infection, unspecified type    -  Primary   Relevant Medications   fluticasone (FLONASE) 50 MCG/ACT nasal spray   dextromethorphan-guaiFENesin (MUCINEX DM) 30-600 MG 12hr tablet   Other Relevant Orders   Veritor Flu A/B Waived   RSV Ag, Immunochr, Waived       Patient declined flu and RSV swabs, will send Flonase and Mucinex or he can buy over-the-counter.  Recommended he do those and call us back if anything worsens, if anything worsens then we will likely do an albuterol inhaler Follow up plan: Return if symptoms worsen or fail to improve.  Counseling provided for all of the vaccine components Orders Placed This Encounter  Procedures   RSV Ag, Immunochr, Waived   Veritor Flu A/B Waived    Arville Care, MD [facility] 05/27/2023, 2:23 PM

## 2023-05-27 NOTE — Telephone Encounter (Signed)
Sick, congestion. ? Pneumonia/bronchitis  Appt scheduled for 2pm today with [facility]. Wife made aware.

## 2023-06-10 ENCOUNTER — Other Ambulatory Visit: Payer: Self-pay | Admitting: Family Medicine

## 2023-06-10 DIAGNOSIS — J069 Acute upper respiratory infection, unspecified: Secondary | ICD-10-CM

## 2023-10-17 ENCOUNTER — Emergency Department (HOSPITAL_COMMUNITY)

## 2023-10-17 ENCOUNTER — Encounter (HOSPITAL_BASED_OUTPATIENT_CLINIC_OR_DEPARTMENT_OTHER): Payer: Self-pay | Admitting: Emergency Medicine

## 2023-10-17 ENCOUNTER — Emergency Department (HOSPITAL_BASED_OUTPATIENT_CLINIC_OR_DEPARTMENT_OTHER)

## 2023-10-17 ENCOUNTER — Emergency Department (HOSPITAL_BASED_OUTPATIENT_CLINIC_OR_DEPARTMENT_OTHER)
Admission: EM | Admit: 2023-10-17 | Discharge: 2023-10-17 | Disposition: A | Discharge status: Home / Self Care | Attending: Emergency Medicine | Admitting: Emergency Medicine

## 2023-10-17 ENCOUNTER — Other Ambulatory Visit: Payer: Self-pay

## 2023-10-17 DIAGNOSIS — R519 Headache, unspecified: Secondary | ICD-10-CM | POA: Diagnosis present

## 2023-10-17 HISTORY — DX: Gastro-esophageal reflux disease without esophagitis: K21.9

## 2023-10-17 LAB — CSF CELL COUNT WITH DIFFERENTIAL
Eosinophils, CSF: 1 % (ref 0–1)
Eosinophils, CSF: 1 % (ref 0–1)
Lymphs, CSF: 40 % (ref 40–80)
Lymphs, CSF: 50 % (ref 40–80)
Monocyte-Macrophage-Spinal Fluid: 12 % — ABNORMAL LOW (ref 15–45)
Monocyte-Macrophage-Spinal Fluid: 8 % — ABNORMAL LOW (ref 15–45)
Other Cells, CSF: 1
RBC Count, CSF: 232000 /mm3 — ABNORMAL HIGH
RBC Count, CSF: 206000 /mm3 — ABNORMAL HIGH
Segmented Neutrophils-CSF: 46 % — ABNORMAL HIGH (ref 0–6)
Segmented Neutrophils-CSF: 41 % — ABNORMAL HIGH (ref 0–6)
Tube #: 1
Tube #: 4
WBC, CSF: 111 /mm3 (ref 0–5)
WBC, CSF: 96 /mm3 (ref 0–5)

## 2023-10-17 LAB — CBC WITH DIFFERENTIAL/PLATELET
Abs Immature Granulocytes: 0.01 10*3/uL (ref 0.00–0.07)
Basophils Absolute: 0.1 10*3/uL (ref 0.0–0.1)
Basophils Relative: 1 %
Eosinophils Absolute: 0.1 10*3/uL (ref 0.0–0.5)
Eosinophils Relative: 2 %
HCT: 39.9 % (ref 39.0–52.0)
Hemoglobin: 13.9 g/dL (ref 13.0–17.0)
Immature Granulocytes: 0 %
Lymphocytes Relative: 46 %
Lymphs Abs: 2.7 10*3/uL (ref 0.7–4.0)
MCH: 29 pg (ref 26.0–34.0)
MCHC: 34.8 g/dL (ref 30.0–36.0)
MCV: 83.1 fL (ref 80.0–100.0)
Monocytes Absolute: 0.4 10*3/uL (ref 0.1–1.0)
Monocytes Relative: 8 %
Neutro Abs: 2.4 10*3/uL (ref 1.7–7.7)
Neutrophils Relative %: 43 %
Platelets: 173 10*3/uL (ref 150–400)
RBC: 4.8 MIL/uL (ref 4.22–5.81)
RDW: 13 % (ref 11.5–15.5)
WBC: 5.6 10*3/uL (ref 4.0–10.5)
nRBC: 0 % (ref 0.0–0.2)

## 2023-10-17 LAB — COMPREHENSIVE METABOLIC PANEL WITH GFR
ALT: 16 U/L (ref 0–44)
AST: 18 U/L (ref 15–41)
Albumin: 3.8 g/dL (ref 3.5–5.0)
Alkaline Phosphatase: 76 U/L (ref 38–126)
Anion gap: 8 (ref 5–15)
BUN: 10 mg/dL (ref 6–20)
CO2: 26 mmol/L (ref 22–32)
Calcium: 8.1 mg/dL — ABNORMAL LOW (ref 8.9–10.3)
Chloride: 106 mmol/L (ref 98–111)
Creatinine, Ser: 1.38 mg/dL — ABNORMAL HIGH (ref 0.61–1.24)
GFR, Estimated: >60 mL/min (ref >60)
Glucose, Bld: 150 mg/dL — ABNORMAL HIGH (ref 70–99)
Potassium: 3.4 mmol/L — ABNORMAL LOW (ref 3.5–5.1)
Sodium: 140 mmol/L (ref 135–145)
Total Bilirubin: 0.7 mg/dL (ref 0.0–1.2)
Total Protein: 6.6 g/dL (ref 6.5–8.1)

## 2023-10-17 LAB — TROPONIN I (HIGH SENSITIVITY)
Troponin I (High Sensitivity): 7 ng/L (ref <18)
Troponin I (High Sensitivity): 8 ng/L (ref <18)

## 2023-10-17 LAB — PROTIME-INR
INR: 1.1 (ref 0.8–1.2)
Prothrombin Time: 14 s (ref 11.4–15.2)

## 2023-10-17 LAB — PROTEIN, CSF: Total  Protein, CSF: 346 mg/dL — ABNORMAL HIGH (ref 15–45)

## 2023-10-17 LAB — APTT: aPTT: 26 s (ref 24–36)

## 2023-10-17 LAB — GLUCOSE, CSF: Glucose, CSF: 73 mg/dL — ABNORMAL HIGH (ref 40–70)

## 2023-10-17 LAB — BRAIN NATRIURETIC PEPTIDE: B Natriuretic Peptide: 35.4 pg/mL (ref 0.0–100.0)

## 2023-10-17 MED ORDER — FENTANYL CITRATE PF 50 MCG/ML IJ SOSY: 25.0000 ug | PREFILLED_SYRINGE | Freq: Once | INTRAMUSCULAR | Status: DC

## 2023-10-17 MED ORDER — ONDANSETRON HCL 4 MG/2ML IJ SOLN
4.0000 mg | Freq: Four times a day (QID) | INTRAMUSCULAR | Status: DC | PRN
  Filled 2023-10-17: qty 2

## 2023-10-17 MED ORDER — METOCLOPRAMIDE HCL 5 MG/ML IJ SOLN
10.0000 mg | Freq: Once | INTRAMUSCULAR | Status: AC
  Administered 2023-10-17: 10 mg via INTRAVENOUS
  Filled 2023-10-17: qty 2

## 2023-10-17 MED ORDER — ACETAMINOPHEN 500 MG PO TABS
1000.0000 mg | ORAL_TABLET | Freq: Once | ORAL | Status: AC
  Administered 2023-10-17: 1000 mg via ORAL
  Filled 2023-10-17: qty 2

## 2023-10-17 MED ORDER — IOHEXOL 350 MG/ML SOLN
100.0000 mL | Freq: Once | INTRAVENOUS | Status: AC | PRN
  Administered 2023-10-17: 75 mL via INTRAVENOUS

## 2023-10-17 MED ORDER — LORAZEPAM 2 MG/ML IJ SOLN
1.0000 mg | Freq: Once | INTRAMUSCULAR | Status: AC
  Administered 2023-10-17: 1 mg via INTRAVENOUS
  Filled 2023-10-17: qty 1

## 2023-10-17 MED ORDER — DEXAMETHASONE SODIUM PHOSPHATE 10 MG/ML IJ SOLN
8.0000 mg | Freq: Once | INTRAMUSCULAR | Status: AC
  Administered 2023-10-17: 8 mg via INTRAVENOUS
  Filled 2023-10-17: qty 1

## 2023-10-17 MED ORDER — DIPHENHYDRAMINE HCL 50 MG/ML IJ SOLN
25.0000 mg | Freq: Once | INTRAMUSCULAR | Status: AC
  Administered 2023-10-17: 25 mg via INTRAVENOUS
  Filled 2023-10-17: qty 1

## 2023-10-17 MED ORDER — MORPHINE SULFATE (PF) 4 MG/ML IV SOLN
4.0000 mg | Freq: Once | INTRAVENOUS | Status: DC
  Filled 2023-10-17: qty 1

## 2023-10-17 MED ORDER — MAGNESIUM SULFATE IN D5W 1-5 GM/100ML-% IV SOLN
1.0000 g | Freq: Once | INTRAVENOUS | Status: AC
  Administered 2023-10-17: 1 g via INTRAVENOUS
  Filled 2023-10-17: qty 100

## 2023-10-17 MED ORDER — LIDOCAINE HCL (PF) 1 % IJ SOLN
30.0000 mL | Freq: Once | INTRAMUSCULAR | Status: AC
  Administered 2023-10-17: 30 mL
  Filled 2023-10-17: qty 30

## 2023-10-17 MED ORDER — METOCLOPRAMIDE HCL 5 MG/ML IJ SOLN
10.0000 mg | Freq: Once | INTRAMUSCULAR | Status: AC
Start: 2023-10-17 — End: 2023-10-17
  Administered 2023-10-17: 10 mg via INTRAVENOUS
  Filled 2023-10-17: qty 2

## 2023-10-17 MED ORDER — PROCHLORPERAZINE EDISYLATE 10 MG/2ML IJ SOLN
10.0000 mg | Freq: Once | INTRAMUSCULAR | Status: AC
  Administered 2023-10-17: 10 mg via INTRAVENOUS
  Filled 2023-10-17: qty 2

## 2023-10-17 MED ORDER — SODIUM CHLORIDE 0.9 % IV BOLUS
1000.0000 mL | Freq: Once | INTRAVENOUS | Status: AC
  Administered 2023-10-17: 1000 mL via INTRAVENOUS

## 2023-10-17 NOTE — ED Notes (Signed)
Handoff report given to [facility] 

## 2023-10-17 NOTE — Consult Note (Addendum)
 [facility] Consult Services  Stat Consult  Patient Name:   John [name], John [name] Date of Birth:   [birth year] Identification Number:   MRN - [id number] Date of Service:   10/17/2023 03:51:00  Diagnosis:       R51.9 - Headache, unspecified       R42 - Dizziness/ Vertigo/ Giddiness  Impression This consult was conducted in real-time using interactive audio and Immunologist. Patient was informed of the technology being used for this visit and agreed to proceed. Patient located in hospital and provider located at home/office setting.  This is a 48 year old M with no significant past medical history who presents to [facility], [place] for sudden onset sever headache, and vertigo.  The patient is a male who presents with a chief complaint of sudden onset severe headache that began at approximately 1:30 AM or 2:00 AM. He reports that the headache reached maximum intensity within 2 seconds of onset, and he has never experienced anything similar in the past. he is in clear distress from the severe headache. The patient denies any history of migraines.  Accompanying the severe headache, the patient has been experiencing nausea, vomiting, and persistent vertigo. On physical examination, he was also found to have a mild decrease in sensation on the right side of his body.  The patient denies any prior history of similar symptoms or headaches of this nature.  On physical exam, the NIHSS is 2.  Right sided sensory decrease and persistent vertigo may suggest stroke, such as lacunar cerebellar. However, with the severe "thunderclap" headache, the possibility of subarachnoid hemorrhage must be taken very seriously as well, which can also cause severe nausea, etc. While cerebellar stroke may rarely have an associated headache, it is not typically this severe nor with thunderclap features (maximum intensity reached in seconds). His CTH was without visible  hemorrhage, however this may be a CT-negative SAH. Given this significant possibility, tPA/TNK is a risky proposition. I had an honest and detailed discussion with both patient and his wife at bedside about tNK/tPA including possible risks and possible benefits. I explained that while tPA/TNK can be used for acute stroke, with this possibility of CT-negative subarachnoid hemorrhage given his thunderclap severe headache, there may be additional risks with such. Additionally, outside of the headache, his other symptoms are relatively mild in the grand scheme of things, with NIHSS 2. As such, patient and wife both agreed to instead proceed with a more conservative approach and not to give tPA/TNk and to instead proceed with additional testing for [facility] workup. Proceed with recommendations as below.  Recommendations:  INR/PT, aPTT, CBC  Hold antiplatelet therapy/NSAIDS/Anticoagulation  Head of bed 30 degrees  Neuro checks q1-2 hrs   Once stable neuro checks q4 hrs  BP control, lower SBP to less than 160 with goal of 140's  Stat CTA head and neck with contrast to access for aneurysm and/or contrast extravasation --> performed and limited due to contrast bolus timing but no clear aneurysm or occlusion.  Due to CTA unrevealing, if symptoms persist, would obtain bedside lumbar puncture to assess for CT-negative SAH (i.e. check for blood). Obtain opening pressure, and cell count.   Repeat CT head in first 8-12hrs  MRI brain without contrast  PRN meclizine  For Headache:  Schedule q6h "Headache Cocktails" consisting of the following, given at the same time: 1000 mg IV acetaminophen (if available) or 1000 mg PO acetaminophen, 25 mg IV diphenhydramine, and 10 mg IV prochlorperazine.   Also, provide  a 500cc normal saline bolus  Also, provide 1,000 mg IV magnesium sulfate once.  If the headache does not resolve after the above, then provide 500 mg IV divalproex sodium once.  If the headache does not  resolve after the above, then provide 10 mg IV dexamethasone once.      ----------------------------------------------------------------------------------------------------  Advanced Imaging: CTA: limited due to contrast bolus toming but no clear aneurysm or occlusion.     Metrics: Dispatch Time: 10/17/2023 03:47:56 Callback Response Time: 10/17/2023 03:51:21  Primary Provider Notified of Diagnostic Impression and Management Plan on: 10/17/2023 04:16:00   CT HEAD: Reviewed no acute hemorrhage seen.    ----------------------------------------------------------------------------------------------------  Chief Complaint: sudden onset sever headache, vertigo  History of Present Illness: Patient is a 48 year old Male. This consult was conducted in real-time using interactive audio and video technology. Patient was informed of the technology being used for this visit and agreed to proceed. Patient located in hospital and provider located at home/office setting.  This is a 48 year old M with no significant past medical history who presents to [facility],  for sudden onset sever headache, and vertigo.  The patient is a male who presents with a chief complaint of sudden onset severe headache that began at approximately 1:30 AM or 2:00 AM. He reports that the headache reached maximum intensity within 2 seconds of onset, and he has never experienced anything similar in the past. he is in clear distress from the severe headache. The patient denies any history of migraines.  Accompanying the severe headache, the patient has been experiencing nausea, vomiting, and persistent vertigo. On physical examination, he was also found to have a mild decrease in sensation on the right side of his body.  The patient denies any prior history of similar symptoms or headaches of this nature.  On physical exam, the NIHSS is : 2.   Past Medical History:      There is no  history of Stroke  Medications:  No Anticoagulant use  No Antiplatelet use Reviewed EMR for current medications  Allergies:  Reviewed  Social History: Drug Use: No  Family History:  There is no family history of premature cerebrovascular disease pertinent to this consultation  ROS : 14 Points Review of Systems was performed and was negative except mentioned in HPI.  Past Surgical History: There Is No Surgical History Contributory To Today's Visit   Examination: BP(123/80), Pulse(75), 1A: Level of Consciousness - Alert; keenly responsive + 0 1B: Ask Month and Age - Both Questions Right + 0 1C: Blink Eyes & Squeeze Hands - Performs Both Tasks + 0 2: Test Horizontal Extraocular Movements - Normal + 0 3: Test Visual Fields - No Visual Loss + 0 4: Test Facial Palsy (Use Grimace if Obtunded) - Normal symmetry + 0 5A: Test Left Arm Motor Drift - No Drift for 10 Seconds + 0 5B: Test Right Arm Motor Drift - No Drift for 10 Seconds + 0 6A: Test Left Leg Motor Drift - No Drift for 5 Seconds + 0 6B: Test Right Leg Motor Drift - No Drift for 5 Seconds + 0 7: Test Limb Ataxia (FNF/Heel-Shin) - Ataxia in 1 Limb + 1 8: Test Sensation - Mild-Moderate Loss: Less Sharp/More Dull + 1 9: Test Language/Aphasia - Normal; No aphasia + 0 10: Test Dysarthria - Normal + 0 11: Test Extinction/Inattention - No abnormality + 0  NIHSS Score: 2 NIHSS Free Text : right sensory loss  Spoke with : ed provider  This consult was conducted in real time using interactive audio and Immunologist. Patient was informed of the technology being used for this visit and agreed to proceed. Patient located in hospital and provider located at home/office setting.  Patient is being evaluated for possible acute neurologic impairment and high probability of imminent or life - threatening deterioration.I spent total of 35 minutes providing care to this patient, including time for face to face visit via  telemedicine, review of medical records, imaging studies and discussion of findings with providers, the patient and / or family.   Dr Flonnie Overman   [facility] For Inpatient follow-up with [facility] physician please call [facility] at [phone]. As we are not an outpatient service for any post hospital discharge needs please contact the hospital for assistance.  If you have any questions for the [facility] physicians or need to reconsult for clinical or diagnostic changes please contact us via [facility] at [phone].

## 2023-10-17 NOTE — ED Provider Notes (Signed)
 [name] Provider Note   CSN: [id number] Arrival date & time: 10/17/23  0258     History  Chief Complaint  Patient presents with   Headache    Toddrick Sanna is a 48 y.o. male.  Patient presents to the emergency department by ambulance from home.  Patient reports that he got home from work around 1:30 AM and was fine.  Wife reports that he was on the phone, acting like his normal self.  Around 2 AM he had sudden onset of severe headache.  Patient with nausea, vomiting and dizziness.  No history of migraines or recurrent headaches.  Patient reportedly had some numbness of his face initially but this has resolved.  He was administered Zofran and 10 mg of IV morphine during transport, arrives sedated.       Home Medications Prior to Admission medications   Medication Sig Start Date End Date Taking? Authorizing Provider  dextromethorphan-guaiFENesin (MUCINEX DM) 30-600 MG 12hr tablet Take 1 tablet by mouth 2 (two) times daily. 05/27/23   Dettinger, Elige Radon, MD  esomeprazole (NEXIUM) 40 MG capsule Take 1 capsule (40 mg total) by mouth daily at 12 noon. 01/16/23   Jenel Lucks, MD  fluticasone (FLONASE) 50 MCG/ACT nasal spray PLACE 1 SPRAY INTO BOTH NOSTRILS 2 (TWO) TIMES DAILY AS NEEDED FOR ALLERGIES OR RHINITIS. 06/10/23   Gabriel Earing, FNP  NONFORMULARY OR COMPOUNDED ITEM Apply 1 Application topically daily. HQ4%/TRET.05%/TAC.1%CR APPLY TO THE AFFECTED AREA(S) DAILY FOR 8 WEEKS AS DIRECTED. WEAR SPF! 09/29/22   [provider]  tadalafil (CIALIS) 10 MG tablet Take 1 tablet (10 mg total) by mouth every other day as needed for erectile dysfunction. 08/21/22   Gabriel Earing, FNP  esomeprazole (NEXIUM) 40 MG capsule Take 1 capsule (40 mg total) by mouth 2 (two) times daily. 11/18/22   Jenel Lucks, MD      Allergies    Patient has no known allergies.    Review of Systems   Review of Systems  Physical  Exam Updated Vital Signs BP 125/66   Pulse 67   Temp (!) 97.5 F (36.4 C) (Oral)   Resp 16   Ht 6' (1.829 m)   Wt 104.3 kg   SpO2 95%   BMI 31.19 kg/m  Physical Exam Vitals and nursing note reviewed.  Constitutional:      General: He is in acute distress.     Appearance: He is well-developed. He is diaphoretic.  HENT:     Head: Normocephalic and atraumatic.     Mouth/Throat:     Mouth: Mucous membranes are moist.  Eyes:     General: Vision grossly intact. Gaze aligned appropriately.     Extraocular Movements: Extraocular movements intact.     Conjunctiva/sclera: Conjunctivae normal.  Cardiovascular:     Rate and Rhythm: Normal rate and regular rhythm.     Pulses: Normal pulses.     Heart sounds: Normal heart sounds, S1 normal and S2 normal. No murmur heard.    No friction rub. No gallop.  Pulmonary:     Effort: Pulmonary effort is normal. No respiratory distress.     Breath sounds: Normal breath sounds.  Abdominal:     Palpations: Abdomen is soft.     Tenderness: There is no abdominal tenderness. There is no guarding or rebound.     Hernia: No hernia is present.  Musculoskeletal:        General: No swelling.  Cervical back: Full passive range of motion without pain, normal range of motion and neck supple. No pain with movement, spinous process tenderness or muscular tenderness. Normal range of motion.     Right lower leg: No edema.     Left lower leg: No edema.  Skin:    General: Skin is warm.     Capillary Refill: Capillary refill takes less than 2 seconds.     Findings: No ecchymosis, erythema, lesion or wound.  Neurological:     Mental Status: He is alert and oriented to person, place, and time.     GCS: GCS eye subscore is 4. GCS verbal subscore is 5. GCS motor subscore is 6.     Cranial Nerves: Cranial nerves 2-12 are intact.     Sensory: Sensation is intact.     Motor: Motor function is intact. No weakness or abnormal muscle tone.     Coordination:  Coordination is intact.  Psychiatric:        Mood and Affect: Mood normal.        Speech: Speech normal.        Behavior: Behavior normal.     ED Results / Procedures / Treatments   Labs (all labs ordered are listed, but only abnormal results are displayed) Labs Reviewed  COMPREHENSIVE METABOLIC PANEL WITH GFR - Abnormal; Notable for the following components:      Result Value   Potassium 3.4 (*)    Glucose, Bld 150 (*)    Creatinine, Ser 1.38 (*)    Calcium 8.1 (*)    All other components within normal limits  CSF CULTURE W GRAM STAIN  GRAM STAIN  CBC WITH DIFFERENTIAL/PLATELET  BRAIN NATRIURETIC PEPTIDE  CSF CELL COUNT WITH DIFFERENTIAL  CSF CELL COUNT WITH DIFFERENTIAL  GLUCOSE, CSF  PROTEIN, CSF  TROPONIN I (HIGH SENSITIVITY)  TROPONIN I (HIGH SENSITIVITY)    EKG EKG Interpretation Date/Time:  Saturday October 17 2023 03:25:53 EDT Ventricular Rate:  38 PR Interval:  170 QRS Duration:  105 QT Interval:  517 QTC Calculation: 411 R Axis:   43  Text Interpretation: Sinus bradycardia Inferior infarct, old Confirmed by Gilda Crease [phone]) on 10/17/2023 3:50:04 AM  Radiology DG Chest Port 1 View Result Date: 10/17/2023 CLINICAL DATA:  Bradycardia. EXAM: PORTABLE CHEST 1 VIEW COMPARISON:  None Available. FINDINGS: Asymmetric elevation right hemidiaphragm. Cardiopericardial silhouette is at upper limits of normal for size. There is pulmonary vascular congestion without overt pulmonary edema. No pleural effusion. No acute bony abnormality. Telemetry leads overlie the chest. IMPRESSION: Pulmonary vascular congestion without overt pulmonary edema. Electronically Signed   By: Kennith Center M.D.   On: 10/17/2023 05:43   CT ANGIO HEAD NECK W WO CM Result Date: 10/17/2023 CLINICAL DATA:  48 year old male with sudden severe headache. EXAM: CT ANGIOGRAPHY HEAD AND NECK TECHNIQUE: Multidetector CT imaging of the head and neck was performed using the standard protocol during  bolus administration of intravenous contrast. Multiplanar CT image reconstructions and MIPs were obtained to evaluate the vascular anatomy. Carotid stenosis measurements (when applicable) are obtained utilizing NASCET criteria, using the distal internal carotid diameter as the denominator. RADIATION DOSE REDUCTION: This exam was performed according to the departmental dose-optimization program which includes automated exposure control, adjustment of the mA and/or kV according to patient size and/or use of iterative reconstruction technique. CONTRAST:  75mL OMNIPAQUE IOHEXOL 350 MG/ML SOLN COMPARISON:  Head CT 0334 hours today. FINDINGS: CTA NECK Skeleton: Maxillary sinus retention cysts greater on the right.  Age advanced lower cervical disc and endplate degeneration C5-C6. No acute osseous abnormality identified. Upper chest: Mild atelectasis and respiratory motion. Negative visible superior mediastinum. Other neck: Neck soft tissue spaces are within normal limits. Aortic arch: 4 vessel arch, left vertebral artery arises directly from the aorta. No arch atherosclerosis. Right carotid system: Mildly tortuous brachiocephalic artery, right CCA origin. Patent to the skull base with no atherosclerosis or stenosis. Tortuous right ICA just below the skull base. Left carotid system: Patent with no atherosclerosis or stenosis. Tortuous left ICA below the skull base. Vertebral arteries: Tortuous proximal right subclavian artery. Normal right vertebral artery origin. Patent right vertebral artery to the skull base with no plaque or stenosis. Non dominant left vertebral artery arises directly from the arch, patent to the skull base with no plaque or stenosis. Both vertebrals have a slightly late entry into the cervical transverse foramen, more so the left (series 8, image 251). CTA HEAD Venous predominant intracranial contrast timing. Posterior circulation: Distal vertebral arteries and vertebrobasilar junction are patent with  no convincing plaque or stenosis. Mildly dominant right V4. Both PICA origins appear patent. Patent basilar artery, no convincing plaque or stenosis. SCA and PCA origins are patent. Posterior communicating arteries are diminutive or absent. Bilateral PCA branches are within normal limits. Anterior circulation: Both ICA siphons are patent with limited detail due to superimposed cavernous sinus venous contrast enhancement. No evidence of siphon plaque or stenosis. Patent carotid termini. Patent MCA and ACA origins. Bilateral ACA branches are within normal limits. Bilateral MCA M1 segments and MCA bifurcations appear patent. No convincing stenosis. Symmetric appearing MCA branches. Venous sinuses: Well opacified, patent. Anatomic variants: Non dominant left vertebral artery arises directly from the aortic arch. Review of the MIP images confirms the above findings IMPRESSION: 1. Intracranial CTA is suboptimal due to contrast timing, venous contamination. 2. Generalized arterial tortuosity but no other arterial abnormality identified in the Head or Neck. 3. 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 Electronically Signed   By: Odessa Fleming M.D.   On: 10/17/2023 05:05   CT HEAD WO CONTRAST ( ) Result Date: 10/17/2023 CLINICAL DATA:  Sudden severe headache EXAM: CT HEAD WITHOUT CONTRAST TECHNIQUE: Contiguous axial images were obtained from the base of the skull through the vertex without intravenous contrast. RADIATION DOSE REDUCTION: This exam was performed according to the departmental dose-optimization program which includes automated exposure control, adjustment of the mA and/or kV according to patient size and/or use of iterative reconstruction technique. COMPARISON:  None Available. FINDINGS: Brain: No mass,hemorrhage or extra-axial collection. Normal appearance of the parenchyma and CSF spaces. Vascular: No hyperdense vessel or unexpected vascular calcification. Skull: The visualized skull base, calvarium and  extracranial soft tissues are normal. Sinuses/Orbits: Right maxillary sinus retention cyst. Other: None. IMPRESSION: Normal head CT. Electronically Signed   By: Deatra Robinson M.D.   On: 10/17/2023 03:52    Procedures Lumbar Puncture  Date/Time: 10/17/2023 6:21 AM  Performed by: Gilda Crease, MD Authorized by: Gilda Crease, MD   Consent:    Consent obtained:  Verbal   Consent given by:  Patient   Risks, benefits, and alternatives were discussed: yes     Risks discussed:  Bleeding, infection, pain, repeat procedure, nerve damage and headache Universal protocol:    Procedure explained and questions answered to patient or proxy's satisfaction: yes     Relevant documents present and verified: yes     Test results available: yes     Imaging studies available: yes  Required blood products, implants, devices, and special equipment available: yes     Immediately prior to procedure a time out was called: yes     Site/side marked: yes     Patient identity confirmed:  Verbally with patient Pre-procedure details:    Procedure purpose:  Diagnostic   Preparation: Patient was prepped and draped in usual sterile fashion   Anesthesia:    Anesthesia method:  Local infiltration   Local anesthetic:  Lidocaine 1% w/o epi Procedure details:    Lumbar space:  L3-L4 interspace   Patient position:  Sitting   Needle gauge:  20   Needle type:  Spinal needle - Quincke tip   Needle length (in):  3.5   Ultrasound guidance: no     Number of attempts:  2   Fluid appearance:  Bloody   Tubes of fluid:  4   Total volume (ml):  4 Post-procedure details:    Puncture site:  Adhesive bandage applied   Procedure completion:  Tolerated well, no immediate complications .Critical Care  Performed by: Gilda Crease, MD Authorized by: Gilda Crease, MD   Critical care provider statement:    Critical care time (minutes):  30   Critical care was necessary to treat or prevent  imminent or life-threatening deterioration of the following conditions:  CNS failure or compromise   Critical care was time spent personally by me on the following activities:  Development of treatment plan with patient or surrogate, discussions with consultants, evaluation of patient's response to treatment, examination of patient, ordering and review of laboratory studies, ordering and review of radiographic studies, ordering and performing treatments and interventions, pulse oximetry, re-evaluation of patient's condition and review of old charts   I assumed direction of critical care for this patient from another provider in my specialty: no     Care discussed with: accepting provider at another facility         Medications Ordered in ED Medications  metoCLOPramide (REGLAN) injection 10 mg (10 mg Intravenous Given 10/17/23 0327)  LORazepam (ATIVAN) injection 1 mg (1 mg Intravenous Given 10/17/23 0423)  sodium chloride 0.9 % bolus 1,000 mL (0 mLs Intravenous Stopped 10/17/23 0525)  iohexol (OMNIPAQUE) 350 MG/ML injection 100 mL (75 mLs Intravenous Contrast Given 10/17/23 0451)  lidocaine (PF) (XYLOCAINE) 1 % injection 30 mL (30 mLs Infiltration Given 10/17/23 0555)    ED Course/ Medical Decision Making/ A&P                                 Medical Decision Making Amount and/or Complexity of Data Reviewed Independent Historian: spouse Labs: ordered. Decision-making details documented in ED Course. Radiology: ordered and independent interpretation performed. Decision-making details documented in ED Course. ECG/medicine tests: ordered and independent interpretation performed. Decision-making details documented in ED Course.  Risk Prescription drug management.   Differential Diagnosis considered includes, but not limited to: Hypertensive emergencies, Idiopathic intracranial hypertension, Space occupying lesions (tumors, abscesses, cysts), Acute hydrocephalus, Dural sinus thrombosis, Intracranial  hemorrhage, Cerebrovascular accident or stroke, Meningitis and encephalitis, Migraine HA, Tension HA.  Patient presents for evaluation of headache.  Patient got home from work around 1:30 AM and was on the phone when he had sudden onset of severe global headache.  This was accompanied by dizziness, nausea, vomiting.  He comes to the ED by ambulance.  He has received 10 mg of morphine and 4 mg of Zofran without improvement.  Patient  ill appearing on arrival, diaphoretic, pale.  Patient sent to radiology for CT head as intracranial bleed including subarachnoid hemorrhage was high on the differential diagnosis.  Patient's CT head did not show any acute bleed.  Neurology consultation obtained.  This was a mixed picture.  He had sudden onset of severe headache but he also had some numbness of his face and right sided sensory deficit for neurologist evaluation.  Stroke is also a possibility.  Neurology consultation appreciated.  No TNK was administered because of the high risk of this being a CT negative subarachnoid hemorrhage.  Recommendation was for CT angiography of head and neck followed by LP if nondiagnostic.  CTA head timing was not adequate for definitive diagnosis.  Discussed with Dr. Margo Aye, radiologist and the CT tech.  It is not clear if repeating the procedure would give different results.  Patient had initially indicated to the neurologist that he did not want to have a lumbar puncture.  I did go back and discussed with him and his family the importance of this and he did consent to it.  I did perform the lumbar puncture at the bedside.  Patient's CSF is blood-tinged.  I collected 4 different tubes.  I do not appreciate a difference in the amount of red color/blood from tube 1-2 before which is concerning.  I am not able to run the cell count and differential here at med center.  This will need to be run at [facility].  Discussed with neurosurgery on-call.  Patient will be transferred to [facility] emergency department, neurosurgery aware of high suspicion for subarachnoid hemorrhage.  Will run the CSF and reconsult as needed.        Final Clinical Impression(s) / ED Diagnoses Final diagnoses:  Primary thunderclap headache    Rx / DC Orders ED Discharge Orders     None         Gilda Crease, MD 10/17/23 [phone]

## 2023-10-17 NOTE — ED Notes (Signed)
 [facility] at bedside

## 2023-10-17 NOTE — Consult Note (Signed)
 Reason for Consult: Headache Referring Physician: EDP  Naythan Douthit is an 48 y.o. male.   HPI:  48 year old [employer] who got home from work around 1 AM doing fine, and then developed a sudden severe holocephalic headache with associated neck pain around 2 AM.  EMS took him to the emergency department.  He had some nausea at the time.  He was given morphine and Zofran.  He denies visual changes.  He had some left facial numbness that resolved.  No back pain or leg pain.  CT scan and CTA were negative.  Lumbar puncture was done which showed 232,000 red cells in the first tube and over 200,000 in the fourth tube.  The supernatant was pink.  Opening pressure was not documented that I can find.  Neurosurgical evaluation was requested.  Past Medical History:  Diagnosis Date   GERD (gastroesophageal reflux disease)     Past Surgical History:  Procedure Laterality Date   HERNIA REPAIR      No Known Allergies  Social History   Tobacco Use   Smoking status: Never   Smokeless tobacco: Never  Substance Use Topics   Alcohol use: Not Currently    Family History  Problem Relation Age of Onset   Breast cancer Maternal Grandmother    Breast cancer Paternal Grandmother    Liver disease Neg Hx    Esophageal cancer Neg Hx    Colon cancer Neg Hx      Review of Systems  Positive ROS: Negative  All other systems have been reviewed and were otherwise negative with the exception of those mentioned in the HPI and as above.  Objective: Vital signs in last 24 hours: Temp:  [96.9 F (36.1 C)-98.3 F (36.8 C)] 98.3 F (36.8 C) (04/05 1209) Pulse Rate:  [33-73] 65 (04/05 1209) Resp:  [14-22] 18 (04/05 1209) BP: (101-139)/(66-84) 133/84 (04/05 1209) SpO2:  [92 %-100 %] 98 % (04/05 1209) Weight:  [104.3 kg] 104.3 kg (04/05 0314)  General Appearance: Alert, cooperative, no distress, appears stated age, appears to have a headache Head: Normocephalic, without obvious abnormality,  atraumatic Eyes: PERRL, conjunctiva/corneas clear, EOM's intact     Throat: benign Neck: Supple, symmetrical, trachea midline Lungs: respirations unlabored Heart: Regular rate and rhythm Abdomen: Soft, non-tender Extremities: Extremities normal, atraumatic, no cyanosis or edema Pulses: 2+ and symmetric all extremities Skin: Skin color, texture, turgor normal, no rashes or lesions  NEUROLOGIC:   Mental status: A&O x4, no aphasia, good attention span, Memory and fund of knowledge appear to be appropriate Motor Exam - grossly normal, normal tone and bulk Sensory Exam - grossly normal Reflexes: symmetric, no pathologic reflexes, No Hoffman's, No clonus Coordination - grossly normal Gait -seems normal transferring from MRI stretcher to gurney Balance -not tested Cranial Nerves: I: smell Not tested  II: visual acuity  OS: na  OD: na  II: visual fields Full to confrontation  II: pupils Equal, round, reactive to light  III,VII: ptosis None  III,IV,VI: extraocular muscles  Full ROM  V: mastication Normal  V: facial light touch sensation  Normal  V,VII: corneal reflex  Present  VII: facial muscle function - upper  Normal  VII: facial muscle function - lower Normal  VIII: hearing Not tested  IX: soft palate elevation  Normal  IX,X: gag reflex Present  XI: trapezius strength  5/5  XI: sternocleidomastoid strength 5/5  XI: neck flexion strength  5/5  XII: tongue strength  Normal    Data Review Lab Results  Component Value Date   WBC 5.6 10/17/2023   HGB 13.9 10/17/2023   HCT 39.9 10/17/2023   MCV 83.1 10/17/2023   PLT 173 10/17/2023   Lab Results  Component Value Date   NA 140 10/17/2023   K 3.4 (L) 10/17/2023   CL 106 10/17/2023   CO2 26 10/17/2023   BUN 10 10/17/2023   CREATININE 1.38 (H) 10/17/2023   GLUCOSE 150 (H) 10/17/2023   Lab Results  Component Value Date   INR 1.1 10/17/2023    Radiology: [facility] Chest Port 1 View Result Date: 10/17/2023 CLINICAL DATA:   Bradycardia. EXAM: PORTABLE CHEST 1 VIEW COMPARISON:  None Available. FINDINGS: Asymmetric elevation right hemidiaphragm. Cardiopericardial silhouette is at upper limits of normal for size. There is pulmonary vascular congestion without overt pulmonary edema. No pleural effusion. No acute bony abnormality. Telemetry leads overlie the chest. IMPRESSION: Pulmonary vascular congestion without overt pulmonary edema. Electronically Signed   By: Kennith Center M.D.   On: 10/17/2023 05:43   CT ANGIO HEAD NECK W WO CM Result Date: 10/17/2023 CLINICAL DATA:  48 year old male with sudden severe headache. EXAM: CT ANGIOGRAPHY HEAD AND NECK TECHNIQUE: Multidetector CT imaging of the head and neck was performed using the standard protocol during bolus administration of intravenous contrast. Multiplanar CT image reconstructions and MIPs were obtained to evaluate the vascular anatomy. Carotid stenosis measurements (when applicable) are obtained utilizing NASCET criteria, using the distal internal carotid diameter as the denominator. RADIATION DOSE REDUCTION: This exam was performed according to the departmental dose-optimization program which includes automated exposure control, adjustment of the mA and/or kV according to patient size and/or use of iterative reconstruction technique. CONTRAST:  75mL OMNIPAQUE IOHEXOL 350 MG/ML SOLN COMPARISON:  Head CT 0334 hours today. FINDINGS: CTA NECK Skeleton: Maxillary sinus retention cysts greater on the right. Age advanced lower cervical disc and endplate degeneration C5-C6. No acute osseous abnormality identified. Upper chest: Mild atelectasis and respiratory motion. Negative visible superior mediastinum. Other neck: Neck soft tissue spaces are within normal limits. Aortic arch: 4 vessel arch, left vertebral artery arises directly from the aorta. No arch atherosclerosis. Right carotid system: Mildly tortuous brachiocephalic artery, right CCA origin. Patent to the skull base with no  atherosclerosis or stenosis. Tortuous right ICA just below the skull base. Left carotid system: Patent with no atherosclerosis or stenosis. Tortuous left ICA below the skull base. Vertebral arteries: Tortuous proximal right subclavian artery. Normal right vertebral artery origin. Patent right vertebral artery to the skull base with no plaque or stenosis. Non dominant left vertebral artery arises directly from the arch, patent to the skull base with no plaque or stenosis. Both vertebrals have a slightly late entry into the cervical transverse foramen, more so the left (series 8, image 251). CTA HEAD Venous predominant intracranial contrast timing. Posterior circulation: Distal vertebral arteries and vertebrobasilar junction are patent with no convincing plaque or stenosis. Mildly dominant right V4. Both PICA origins appear patent. Patent basilar artery, no convincing plaque or stenosis. SCA and PCA origins are patent. Posterior communicating arteries are diminutive or absent. Bilateral PCA branches are within normal limits. Anterior circulation: Both ICA siphons are patent with limited detail due to superimposed cavernous sinus venous contrast enhancement. No evidence of siphon plaque or stenosis. Patent carotid termini. Patent MCA and ACA origins. Bilateral ACA branches are within normal limits. Bilateral MCA M1 segments and MCA bifurcations appear patent. No convincing stenosis. Symmetric appearing MCA branches. Venous sinuses: Well opacified, patent. Anatomic variants: Non dominant left vertebral  artery arises directly from the aortic arch. Review of the MIP images confirms the above findings IMPRESSION: 1. Intracranial CTA is suboptimal due to contrast timing, venous contamination. 2. Generalized arterial tortuosity but no other arterial abnormality identified in the Head or Neck. 3. 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 Electronically Signed   By: Odessa Fleming M.D.   On: 10/17/2023 05:05   CT HEAD WO  CONTRAST ( ) Result Date: 10/17/2023 CLINICAL DATA:  Sudden severe headache EXAM: CT HEAD WITHOUT CONTRAST TECHNIQUE: Contiguous axial images were obtained from the base of the skull through the vertex without intravenous contrast. RADIATION DOSE REDUCTION: This exam was performed according to the departmental dose-optimization program which includes automated exposure control, adjustment of the mA and/or kV according to patient size and/or use of iterative reconstruction technique. COMPARISON:  None Available. FINDINGS: Brain: No mass,hemorrhage or extra-axial collection. Normal appearance of the parenchyma and CSF spaces. Vascular: No hyperdense vessel or unexpected vascular calcification. Skull: The visualized skull base, calvarium and extracranial soft tissues are normal. Sinuses/Orbits: Right maxillary sinus retention cyst. Other: None. IMPRESSION: Normal head CT. Electronically Signed   By: Deatra Robinson M.D.   On: 10/17/2023 03:52   I stood behind the MRI tech and watched the images come through.  I watched him cut the MRA.  I saw no evidence of aneurysm and no evidence of intracranial hemorrhage at first glance.  The images will be reviewed by the radiologist for formal read.  He does have sinus disease on the right.  Assessment/Plan: Estimated body mass index is 31.19 kg/m as calculated from the following:   Height as of this encounter: 6' (1.829 m).   Weight as of this encounter: 104.3 kg.   Will await formal results of MRI/MRA.  CT scan negative for subarachnoid hemorrhage or other acute intracranial abnormality.  CTA negative for aneurysm but was suboptimal.  I suspect that the LP was a traumatic tap, though it is difficult to know for sure.  I have seen pictures of the tubes and it would be very difficult for modern day CT scanners to miss that much blood in the CSF on a CT scan of the head in the acute phase.  Therefore I think that the risk of this being a aneurysmal subarachnoid  hemorrhage is quite low  unless the results of the MRI MRA change this.  If questions remain, could be admitted for repeat workup such as repeat LP to check opening pressure, check red cell count, and check for xanthochromia and/or repeat MRA or CTA in a few days.   Tia Alert 10/17/2023 3:09 PM

## 2023-10-17 NOTE — ED Notes (Addendum)
-  Called [facility] at 638am for transportation to [facility] ED - Dr. Pilar Plate accepting. Advised [facility] patient needed to be transported to [facility] ED as quickly as possible per EDP. Stated they would let AM shift know. AM trucks are normally dispatched out around 720am. Will advised AM secretary to touch base with [facility] around then for update on transportation.

## 2023-10-17 NOTE — ED Provider Notes (Signed)
  Accepted handoff at shift change from Dr. Blinda Leatherwood. Please see prior provider note for more detail.   Briefly: Patient is 48 y.o. "Patient reports that he got home from work around 1:30 AM and was fine. Wife reports that he was on the phone, acting like his normal self. Around 2 AM he had sudden onset of severe headache. Patient with nausea, vomiting and dizziness. No history of migraines or recurrent headaches. Patient reportedly had some numbness of his face initially but this has resolved. He was administered Zofran and 10 mg of IV morphine during transport, arrives sedated."  DDX: concern for Hypertensive emergencies, Idiopathic intracranial hypertension, Space occupying lesions (tumors, abscesses, cysts), Acute hydrocephalus, Dural sinus thrombosis, Intracranial hemorrhage, Cerebrovascular accident or stroke, Meningitis and encephalitis, Migraine HA, Tension HA.   Plan:  - CT reassuring. Spinal tap concerning for [facility] - Neurology recommendations posted in Dr. Michaell Cowing note. - Patient currently with headache and dizziness when moving his head - the facial numbness has resolved. Neuro exam unremarkable. Patient afebrile with stable vitals. - Spoke with Verlin Dike who was made aware that patient was in ED and will follow up with CSF results. - CSF with elevated RBC - tube #1 with 232,000 and tube #4 with 206,000. Talked with Neurosurgery PA again who was reassured by patient's imaging and does not think that [facility] is causing patient's symptoms. They state that there is nothing for neurosurgery to do for this patient. They recommended Neurology consult.  - Talked with Neurologist Dr. Derry Lory who recommended MRI and MRA brain. If these are clear, patient I cleared for discharge from neurology standpoint.   3PM Care of [name] transferred to PA Henderly at the end of my shift as the patient will require reassessment once labs/imaging have resulted. Patient presentation, ED course, and  plan of care discussed with review of all pertinent labs and imaging. Please see his/her note for further details regarding further ED course and disposition. Plan at time of handoff is reassess patient after MRI/MRA brain. Patient will be cleared for discharge if these results are negative. This may be altered or completely changed at the discretion of the oncoming team pending results of further workup.        Dorthy Cooler, [place] 10/17/23 1457    Linwood Dibbles, MD 10/18/23 [phone]

## 2023-10-17 NOTE — ED Notes (Signed)
 PT given ice pack.  States vomited in MRI.

## 2023-10-17 NOTE — ED Notes (Signed)
 Pt denies pain and nausea.  Tolerated po.  States he is ready to go home.  Provider notified.

## 2023-10-17 NOTE — ED Notes (Signed)
 ED Provider at bedside.

## 2023-10-17 NOTE — ED Provider Notes (Signed)
 Care assumed from previous provider. See their note for full HPI and disposition  FU on MR brain and MRA,   Sal with neuro>> if neg MR can dc home.  [place] spoke with Sal with neuro again.  Reviewed MRI imaging.  After migraine cocktail patient can DC home Physical Exam  BP 118/69   Pulse 75   Temp 98.2 F (36.8 C) (Axillary)   Resp 17   Ht 6' (1.829 m)   Wt 104.3 kg   SpO2 93%   BMI 31.19 kg/m   Physical Exam Vitals and nursing note reviewed.  Constitutional:      General: He is not in acute distress.    Appearance: He is well-developed. He is not ill-appearing or diaphoretic.  HENT:     Head: Atraumatic.  Eyes:     Pupils: Pupils are equal, round, and reactive to light.  Cardiovascular:     Rate and Rhythm: Normal rate and regular rhythm.  Pulmonary:     Effort: Pulmonary effort is normal. No respiratory distress.  Abdominal:     General: There is no distension.     Palpations: Abdomen is soft.  Musculoskeletal:        General: Normal range of motion.     Cervical back: Normal range of motion and neck supple.  Skin:    General: Skin is warm and dry.  Neurological:     General: No focal deficit present.     Mental Status: He is alert and oriented to person, place, and time.     Comments: Moves all extremities CN 2-12 grossly intact ambulatory     Procedures  Procedures Labs Reviewed  COMPREHENSIVE METABOLIC PANEL WITH GFR - Abnormal; Notable for the following components:      Result Value   Potassium 3.4 (*)    Glucose, Bld 150 (*)    Creatinine, Ser 1.38 (*)    Calcium 8.1 (*)    All other components within normal limits  CSF CELL COUNT WITH DIFFERENTIAL - Abnormal; Notable for the following components:   Color, CSF RED (*)    Appearance, CSF CLOUDY (*)    RBC Count, CSF 232,000 (*)    WBC, CSF 111 (*)    Segmented Neutrophils-CSF 46 (*)    Monocyte-Macrophage-Spinal Fluid 12 (*)    All other components within normal limits  CSF CELL COUNT WITH  DIFFERENTIAL - Abnormal; Notable for the following components:   Color, CSF RED (*)    Appearance, CSF CLOUDY (*)    RBC Count, CSF 206,000 (*)    WBC, CSF 96 (*)    Segmented Neutrophils-CSF 41 (*)    Monocyte-Macrophage-Spinal Fluid 8 (*)    All other components within normal limits  GLUCOSE, CSF - Abnormal; Notable for the following components:   Glucose, CSF 73 (*)    All other components within normal limits  PROTEIN, CSF - Abnormal; Notable for the following components:   Total  Protein, CSF 346 (*)    All other components within normal limits  CSF CULTURE W GRAM STAIN  CBC WITH DIFFERENTIAL/PLATELET  BRAIN NATRIURETIC PEPTIDE  PROTIME-INR  APTT  PATHOLOGIST SMEAR REVIEW  TROPONIN I (HIGH SENSITIVITY)  TROPONIN I (HIGH SENSITIVITY)   MR ANGIO HEAD WO CONTRAST Result Date: 10/17/2023 CLINICAL DATA:  Sudden severe headache. EXAM: MRA HEAD WITHOUT CONTRAST TECHNIQUE: Angiographic images of the Circle of Willis were acquired using MRA technique without intravenous contrast. COMPARISON:  CT angio head and neck 10/17/2023 FINDINGS: Anterior circulation:  Mild tortuosity is present in the upper cervical ICA. No focal stenosis is present through the ICA termini. The A1 and M1 segments are normal. MCA bifurcations are within normal limits. The ACA and MCA branch vessels are normal bilaterally. No aneurysm is present. Posterior circulation: PICA origins are visualized and within normal limits. The right vertebral artery is dominant vessel. The vertebrobasilar junction and basilar artery are normal. The superior cerebellar arteries are patent. The PCA branch vessels are within normal limits bilaterally. No aneurysm is present. Anatomic variants: None IMPRESSION: Normal MRA Circle of Willis without significant proximal stenosis, aneurysm, or branch vessel occlusion. Electronically Signed   By: Marin Roberts M.D.   On: 10/17/2023 15:23   MR BRAIN WO CONTRAST Result Date: 10/17/2023 CLINICAL  DATA:  Mental status change.  Unknown cause EXAM: MRI HEAD WITHOUT CONTRAST TECHNIQUE: Multiplanar, multiecho pulse sequences of the brain and surrounding structures were obtained without intravenous contrast. COMPARISON:  CT head and CT angio head and neck 10/17/2023 FINDINGS: Brain: No acute infarct, hemorrhage, or mass lesion is present. Scattered subcortical T2 hyperintensities are moderately advanced for age. Deep brain nuclei are within normal limits. The ventricles are of normal size. No significant extraaxial fluid collection is present. Vascular: Flow is present in the major intracranial arteries. Skull and upper cervical spine: The craniocervical junction is normal. Upper cervical spine is within normal limits. Marrow signal is unremarkable. Sinuses/Orbits: Polyps or mucous retention cysts are present the right maxillary sinus. The paranasal sinuses and mastoid air cells are otherwise clear. The globes and orbits are within normal limits. IMPRESSION: 1. No acute intracranial abnormality. 2. Scattered subcortical T2 hyperintensities bilaterally are moderately advanced for age. The finding is nonspecific but can be seen in the setting of chronic microvascular ischemia, a demyelinating process such as multiple sclerosis, vasculitis, complicated migraine headaches, or as the sequelae of a prior infectious or inflammatory process. Electronically Signed   By: Marin Roberts M.D.   On: 10/17/2023 15:19   DG Chest Port 1 View Result Date: 10/17/2023 CLINICAL DATA:  Bradycardia. EXAM: PORTABLE CHEST 1 VIEW COMPARISON:  None Available. FINDINGS: Asymmetric elevation right hemidiaphragm. Cardiopericardial silhouette is at upper limits of normal for size. There is pulmonary vascular congestion without overt pulmonary edema. No pleural effusion. No acute bony abnormality. Telemetry leads overlie the chest. IMPRESSION: Pulmonary vascular congestion without overt pulmonary edema. Electronically Signed   By: Kennith Center M.D.   On: 10/17/2023 05:43   CT ANGIO HEAD NECK W WO CM Result Date: 10/17/2023 CLINICAL DATA:  48 year old male with sudden severe headache. EXAM: CT ANGIOGRAPHY HEAD AND NECK TECHNIQUE: Multidetector CT imaging of the head and neck was performed using the standard protocol during bolus administration of intravenous contrast. Multiplanar CT image reconstructions and MIPs were obtained to evaluate the vascular anatomy. Carotid stenosis measurements (when applicable) are obtained utilizing NASCET criteria, using the distal internal carotid diameter as the denominator. RADIATION DOSE REDUCTION: This exam was performed according to the departmental dose-optimization program which includes automated exposure control, adjustment of the mA and/or kV according to patient size and/or use of iterative reconstruction technique. CONTRAST:  75mL OMNIPAQUE IOHEXOL 350 MG/ML SOLN COMPARISON:  Head CT 0334 hours today. FINDINGS: CTA NECK Skeleton: Maxillary sinus retention cysts greater on the right. Age advanced lower cervical disc and endplate degeneration C5-C6. No acute osseous abnormality identified. Upper chest: Mild atelectasis and respiratory motion. Negative visible superior mediastinum. Other neck: Neck soft tissue spaces are within normal limits. Aortic arch: 4  vessel arch, left vertebral artery arises directly from the aorta. No arch atherosclerosis. Right carotid system: Mildly tortuous brachiocephalic artery, right CCA origin. Patent to the skull base with no atherosclerosis or stenosis. Tortuous right ICA just below the skull base. Left carotid system: Patent with no atherosclerosis or stenosis. Tortuous left ICA below the skull base. Vertebral arteries: Tortuous proximal right subclavian artery. Normal right vertebral artery origin. Patent right vertebral artery to the skull base with no plaque or stenosis. Non dominant left vertebral artery arises directly from the arch, patent to the skull base with no  plaque or stenosis. Both vertebrals have a slightly late entry into the cervical transverse foramen, more so the left (series 8, image 251). CTA HEAD Venous predominant intracranial contrast timing. Posterior circulation: Distal vertebral arteries and vertebrobasilar junction are patent with no convincing plaque or stenosis. Mildly dominant right V4. Both PICA origins appear patent. Patent basilar artery, no convincing plaque or stenosis. SCA and PCA origins are patent. Posterior communicating arteries are diminutive or absent. Bilateral PCA branches are within normal limits. Anterior circulation: Both ICA siphons are patent with limited detail due to superimposed cavernous sinus venous contrast enhancement. No evidence of siphon plaque or stenosis. Patent carotid termini. Patent MCA and ACA origins. Bilateral ACA branches are within normal limits. Bilateral MCA M1 segments and MCA bifurcations appear patent. No convincing stenosis. Symmetric appearing MCA branches. Venous sinuses: Well opacified, patent. Anatomic variants: Non dominant left vertebral artery arises directly from the aortic arch. Review of the MIP images confirms the above findings IMPRESSION: 1. Intracranial CTA is suboptimal due to contrast timing, venous contamination. 2. Generalized arterial tortuosity but no other arterial abnormality identified in the Head or Neck. 3. 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 Electronically Signed   By: Odessa Fleming M.D.   On: 10/17/2023 05:05   CT HEAD WO CONTRAST ( ) Result Date: 10/17/2023 CLINICAL DATA:  Sudden severe headache EXAM: CT HEAD WITHOUT CONTRAST TECHNIQUE: Contiguous axial images were obtained from the base of the skull through the vertex without intravenous contrast. RADIATION DOSE REDUCTION: This exam was performed according to the departmental dose-optimization program which includes automated exposure control, adjustment of the mA and/or kV according to patient size and/or use of  iterative reconstruction technique. COMPARISON:  None Available. FINDINGS: Brain: No mass,hemorrhage or extra-axial collection. Normal appearance of the parenchyma and CSF spaces. Vascular: No hyperdense vessel or unexpected vascular calcification. Skull: The visualized skull base, calvarium and extracranial soft tissues are normal. Sinuses/Orbits: Right maxillary sinus retention cyst. Other: None. IMPRESSION: Normal head CT. Electronically Signed   By: Deatra Robinson M.D.   On: 10/17/2023 03:52    ED Course / MDM   Clinical Course as of 10/17/23 1849  Sat Oct 17, 2023  1550 Sal with Neuro states migraine cocktail and dc home [BH]    Clinical Course User Index [BH] Karlene Southard A, PA-C    FU on MR brain and MRA,   Sal with neuro>> if neg MR can dc home.  [place] spoke with Sal with neuro again.  Reviewed MRI imaging.  After migraine cocktail patient can DC home  Patient reassessed.  Symptoms improved, feeling better wants to go home. Outpatient orders placed for neuro follow-up per neurology recommendations.  Nonfocal neuroexam, ambulatory here, tolerating p.o. intake.   Medical Decision Making Amount and/or Complexity of Data Reviewed Independent Historian: spouse External Data Reviewed: labs, radiology and notes. Labs: ordered. Decision-making details documented in ED Course. Radiology: ordered and independent  interpretation performed. Decision-making details documented in ED Course.  Risk OTC drugs. Prescription drug management. Parenteral controlled substances. Decision regarding hospitalization. Diagnosis or treatment significantly limited by social determinants of health.          Linwood Dibbles, PA-C 10/17/23 1849    Wynetta Fines, MD 10/18/23 0000

## 2023-10-17 NOTE — ED Triage Notes (Addendum)
 Patient presents via EMS from home with c/o headache, dizziness, nausea and vomiting that started at approx 0200. Patient medicated with 4mg  IV Zofran  and 10mg  IV Morphine en route to ED with no improvement. Patient also reports episode of facial numbness that resolved after 30 minutes.  Patient pale and diaphoretic upon arrival to ED

## 2023-10-17 NOTE — Discharge Instructions (Signed)
 I have placed a neurology follow-up.  You did not hear from neurology by Wednesday of next week please call the number listed in your discharge paperwork  Return for new or worsening symptoms

## 2023-10-19 ENCOUNTER — Ambulatory Visit
Admission: RE | Admit: 2023-10-19 | Discharge: 2023-10-19 | Disposition: A | Discharge status: Home / Self Care | Source: Ambulatory Visit | Attending: Neurology | Admitting: Neurology

## 2023-10-19 ENCOUNTER — Encounter: Payer: Self-pay | Admitting: Neurology

## 2023-10-19 ENCOUNTER — Ambulatory Visit: Admitting: Neurology

## 2023-10-19 VITALS — BP 143/94 | HR 58 | Ht 72.0 in | Wt 246.2 lb

## 2023-10-19 DIAGNOSIS — G459 Transient cerebral ischemic attack, unspecified: Secondary | ICD-10-CM | POA: Diagnosis not present

## 2023-10-19 DIAGNOSIS — G971 Other reaction to spinal and lumbar puncture: Secondary | ICD-10-CM

## 2023-10-19 DIAGNOSIS — I517 Cardiomegaly: Secondary | ICD-10-CM

## 2023-10-19 DIAGNOSIS — R0602 Shortness of breath: Secondary | ICD-10-CM | POA: Diagnosis not present

## 2023-10-19 LAB — PATHOLOGIST SMEAR REVIEW

## 2023-10-19 MED ORDER — IOPAMIDOL (ISOVUE-M 200) INJECTION 41%
1.0000 mL | Freq: Once | INTRAMUSCULAR | Status: AC
  Administered 2023-10-19: 1 mL via EPIDURAL

## 2023-10-19 NOTE — Discharge Instructions (Signed)
 Blood Patch Discharge Instructions ? ?Go home and rest quietly for the next 24 hours.  It is important to lie flat for the next 24 hours.  Get up only to go to the restroom.  You may lie in the bed or on a couch on your back, your stomach, your left side or your right side.  You may have one pillow under your head.  You may have pillows between your knees while you are on your side or under your knees while you are on your back. ? ?DO NOT drive today.  Recline the seat as far back as it will go, while still wearing your seat belt, on the way home. ? ?You may get up to go to the bathroom as needed.  You may sit up for 10 minutes to eat.  You may resume your normal diet and medications unless otherwise indicated.  Drink lots of extra fluids today and tomorrow..  ? ?You may resume normal activities after your 24 hours of bed rest is over; however, do not exert yourself strongly or do any heavy lifting tomorrow. ? ?Call your physician for a follow-up appointment.  ? ?If you have any questions  after you arrive home, please call [phone]. ? ?Discharge instructions have been explained to the patient.  The patient, or the person responsible for the patient, fully understands these instructions. ? ?   ?

## 2023-10-19 NOTE — Patient Instructions (Addendum)
 Need a stroke workup given right-sided numbness and weakness: Echocardiopgram of the heart with bubble study(cardiology) to look at structure of the heart Trans cranial doppler () to look for a patent foramen ovale(PFO) 30-day heart monitor to look for abnormal heart rhythms  Blood work (hgba1c and cholesterol panel) come back for that Send to cardiology   Stroke Prevention Some medical conditions and behaviors can lead to a higher chance of having a stroke. You can help prevent a stroke by eating healthy, exercising, not smoking, and managing any medical conditions you have. Stroke is a leading cause of functional impairment. Primary prevention is particularly important because a majority of strokes are first-time events. Stroke changes the lives of not only those who experience a stroke but also their family and other caregivers. How can this condition affect me? A stroke is a medical emergency and should be treated right away. A stroke can lead to brain damage and can sometimes be life-threatening. If a person gets medical treatment right away, there is a better chance of surviving and recovering from a stroke. What can increase my risk? The following medical conditions may increase your risk of a stroke: Cardiovascular disease. High blood pressure (hypertension). Diabetes. High cholesterol. Sickle cell disease. Blood clotting disorders (hypercoagulable state). Obesity. Sleep disorders (obstructive sleep apnea). Other risk factors include: Being older than age 64. Having a history of blood clots, stroke, or mini-stroke (transient ischemic attack, TIA). Genetic factors, such as race, ethnicity, or a family history of stroke. Smoking cigarettes or using other tobacco products. Taking birth control pills, especially if you also use tobacco. Heavy use of alcohol or drugs, especially cocaine and methamphetamine. Physical inactivity. What actions can I take to prevent  this? Manage your health conditions High cholesterol levels. Eating a healthy diet is important for preventing high cholesterol. If cholesterol cannot be managed through diet alone, you may need to take medicines. Take any prescribed medicines to control your cholesterol as told by your health care provider. Hypertension. To reduce your risk of stroke, try to keep your blood pressure below 130/80. Eating a healthy diet and exercising regularly are important for controlling blood pressure. If these steps are not enough to manage your blood pressure, you may need to take medicines. Take any prescribed medicines to control hypertension as told by your health care provider. Ask your health care provider if you should monitor your blood pressure at home. Have your blood pressure checked every year, even if your blood pressure is normal. Blood pressure increases with age and some medical conditions. Diabetes. Eating a healthy diet and exercising regularly are important parts of managing your blood sugar (glucose). If your blood sugar cannot be managed through diet and exercise, you may need to take medicines. Take any prescribed medicines to control your diabetes as told by your health care provider. Get evaluated for obstructive sleep apnea. Talk to your health care provider about getting a sleep evaluation if you snore a lot or have excessive sleepiness. Make sure that any other medical conditions you have, such as atrial fibrillation or atherosclerosis, are managed. Nutrition Follow instructions from your health care provider about what to eat or drink to help manage your health condition. These instructions may include: Reducing your daily calorie intake. Limiting how much salt (sodium) you use to 1,500 milligrams (mg) each day. Using only healthy fats for cooking, such as olive oil, canola oil, or sunflower oil. Eating healthy foods. You can do this by: Choosing foods that are  high in fiber,  such as whole grains, and fresh fruits and vegetables. Eating at least 5 servings of fruits and vegetables a day. Try to fill one-half of your plate with fruits and vegetables at each meal. Choosing lean protein foods, such as lean cuts of meat, poultry without skin, fish, tofu, beans, and nuts. Eating low-fat dairy products. Avoiding foods that are high in sodium. This can help lower blood pressure. Avoiding foods that have saturated fat, trans fat, and cholesterol. This can help prevent high cholesterol. Avoiding processed and prepared foods. Counting your daily carbohydrate intake.  Lifestyle If you drink alcohol: Limit how much you have to: 0-1 drink a day for women who are not pregnant. 0-2 drinks a day for men. Know how much alcohol is in your drink. In the [place] one drink equals one 12 oz bottle of beer ( ), one 5 oz glass of wine ( ), or one 1 oz glass of hard liquor (44mL). Do not use any products that contain nicotine or tobacco. These products include cigarettes, chewing tobacco, and vaping devices, such as e-cigarettes. If you need help quitting, ask your health care provider. Avoid secondhand smoke. Do not use drugs. Activity  Try to stay at a healthy weight. Get at least 30 minutes of exercise on most days, such as: Fast walking. Biking. Swimming. Medicines Take over-the-counter and prescription medicines only as told by your health care provider. Aspirin or blood thinners (antiplatelets or anticoagulants) may be recommended to reduce your risk of forming blood clots that can lead to stroke. Avoid taking birth control pills. Talk to your health care provider about the risks of taking birth control pills if: You are over 3 years old. You smoke. You get very bad headaches. You have had a blood clot. Where to find more information American Stroke Association: www.strokeassociation.org Get help right away if: You or a loved one has any symptoms of a stroke. "BE  FAST" is an easy way to remember the main warning signs of a stroke: B - Balance. Signs are dizziness, sudden trouble walking, or loss of balance. E - Eyes. Signs are trouble seeing or a sudden change in vision. F - Face. Signs are sudden weakness or numbness of the face, or the face or eyelid drooping on one side. A - Arms. Signs are weakness or numbness in an arm. This happens suddenly and usually on one side of the body. S - Speech. Signs are sudden trouble speaking, slurred speech, or trouble understanding what people say. T - Time. Time to call emergency services. Write down what time symptoms started. You or a loved one has other signs of a stroke, such as: A sudden, severe headache with no known cause. Nausea or vomiting. Seizure. These symptoms may represent a serious problem that is an emergency. Do not wait to see if the symptoms will go away. Get medical help right away. Call your local emergency services (911 in the [place] Do not drive yourself to the hospital. Summary You can help to prevent a stroke by eating healthy, exercising, not smoking, limiting alcohol intake, and managing any medical conditions you may have. Do not use any products that contain nicotine or tobacco. These include cigarettes, chewing tobacco, and vaping devices, such as e-cigarettes. If you need help quitting, ask your health care provider. Remember "BE FAST" for warning signs of a stroke. Get help right away if you or a loved one has any of these signs. This information is not intended to replace advice  given to you by your health care provider. Make sure you discuss any questions you have with your health care provider. Document Revised: 06/02/2022 Document Reviewed: 06/02/2022 Elsevier Patient Education  2024 [employer].

## 2023-10-19 NOTE — Progress Notes (Signed)
 20cc blood collected from pts Right AC prior to blood patch procedure. Pt tolerated well. 1 successful attempt. Gauze and tape applied after.

## 2023-10-19 NOTE — Progress Notes (Signed)
 [facility]    Provider:  Dr Lucia [name] Requesting Provider: Linwood Dibbles, PA-C Primary Care Provider:  Gabriel Earing, FNP  CC:  f/u hospital, post-LP headache, Thunderclap headache  HPI:  John [name] is [name] 48 y.o. male here as requested by John [name], Britni A, PA-C for headaches.  Was seen for acute "thundeclap"  headache with CTA/MRA negative for aneurysm, negative LP (traumatic tap) MRI of the brain without acute etiology however showed peripherally-located white matter changes advanced for age and medical conditions(can be seen in PFO), he had acute onset right-sided numbness and weakness concerning for TIA, he has shortness of breath/nocturnal cough and XRay showed Cardiopericardial silhouette is at upper limits of normal for size and pulmonary vascular congestion concerning for cardiac etiology of TIA.    Today he feels very well when laying down so hopefully his acute headache has resolved and this is post-LP headache. He had an LP. He has [name] positional headache, better with laying down and worse with sitting or standing. He is dizzy when sitting up and the pressure on the back of the neck is worse. Been 48 hours and still positional, no headache when laying down, headache when sitting or standing. Discussed blood patch with patient and called [name] to see if we can get it today. This not the headache he went in with, the initial headache has resolved. This is [name] new headache since the LP, positional, no headache when laying down, worse when sitting up.  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 The headache was on the top of the head, pressure, acute, severe,  instant, 10/10 pain, felt like he was going to pass out, tyelnol and other analgesics did not help, he immediately knew somethng was wrong. No vision changes, but did have numbness to the [name] and right side, weakness on the right side and looks like he is dragging his leg on the right. He has SOB and he has [name] growth on his adrenal gland. He has had workup on his heart. Heart silouette is , coughing at night, shortness of breath. John [name] is his pcp and has an appointment.   Reviewed notes, labs and imaging from outside physicians, which showed   MRI brain 10/17/2023: IMPRESSION: 1. No acute intracranial abnormality. 2. Scattered subcortical T2 hyperintensities bilaterally are moderately advanced for age. The finding is nonspecific but can be seen in the setting of chronic microvascular ischemia, [name] demyelinating process such as multiple sclerosis, vasculitis, complicated migraine headaches, or as the sequelae of [name] prior infectious or inflammatory process.  MRA head:  IMPRESSION: Normal MRA Circle of Willis without significant proximal stenosis, aneurysm, or branch vessel occlusion.  CTA IMPRESSION: 1. Intracranial CTA is suboptimal due to contrast timing, venous contamination. 2. Generalized arterial tortuosity but no other arterial abnormality identified in the Head or Neck. 3. 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    CT head FINDINGS: Brain: No mass,hemorrhage or extra-axial collection. Normal appearance of the parenchyma and CSF spaces.   Vascular: No hyperdense vessel or unexpected vascular calcification.   Skull: The visualized skull base, calvarium and extracranial soft tissues are normal.  Sinuses/Orbits: Right maxillary sinus retention cyst.   Other: None.   IMPRESSION: Normal head CT   Recent Results (from the past 2160 hours)  CBC with Differential/Platelet     Status: None   Collection Time: 10/17/23  3:26 AM  Result Value Ref Range   WBC  5.6 4.0 - 10.5 K/uL   RBC 4.80 4.22 - 5.81 MIL/uL   Hemoglobin 13.9 13.0 - 17.0 g/dL   HCT 16.1 09.6 - 04.5 %   MCV 83.1 80.0 - 100.0 fL   MCH 29.0 26.0 - 34.0 pg   MCHC 34.8 30.0 - 36.0 g/dL   RDW 40.9 81.1 - 91.4 %   Platelets 173 150 - 400 K/uL   nRBC 0.0 0.0 - 0.2 %   Neutrophils Relative % 43 %   Neutro Abs 2.4 1.7 - 7.7 K/uL   Lymphocytes Relative 46 %   Lymphs Abs 2.7 0.7 - 4.0 K/uL   Monocytes Relative 8 %   Monocytes Absolute 0.4 0.1 - 1.0 K/uL   Eosinophils Relative 2 %   Eosinophils Absolute 0.1 0.0 - 0.5 K/uL   Basophils Relative 1 %   Basophils Absolute 0.1 0.0 - 0.1 K/uL   Immature Granulocytes 0 %   Abs Immature Granulocytes 0.01 0.00 - 0.07 K/uL    Comment: Performed at [name], [address], [place], [place] [address]  Comprehensive metabolic panel with GFR     Status: Abnormal   Collection Time: 10/17/23  3:26 AM  Result Value Ref Range   Sodium 140 135 - 145 mmol/L   Potassium 3.4 (L) 3.5 - 5.1 mmol/L   Chloride 106 98 - 111 mmol/L   CO2 26 22 - 32 mmol/L   Glucose, Bld 150 (H) 70 - 99 mg/dL    Comment: Glucose reference range applies only to samples taken after fasting for at least 8 hours.   BUN 10 6 - 20 mg/dL   Creatinine, Ser 6.21 (H) 0.61 - 1.24 mg/dL   Calcium 8.1 (L) 8.9 - 10.3 mg/dL   Total Protein 6.6 6.5 - 8.1 g/dL   Albumin 3.8 3.5 - 5.0 g/dL   AST 18 15 - 41 U/L   ALT 16 0 - 44 U/L   Alkaline Phosphatase 76 38 - 126 U/L   Total Bilirubin 0.7 0.0 - 1.2 mg/dL   GFR, Estimated >30 >86 mL/min    Comment: (NOTE) Calculated using the CKD-EPI Creatinine Equation (2021)    Anion gap 8 5 - 15    Comment: Performed at [name], [address], [place], [place] [address]  Brain natriuretic peptide     Status: None   Collection Time: 10/17/23  3:26 AM  Result Value Ref Range   B Natriuretic Peptide 35.4 0.0 - 100.0 pg/mL    Comment: Performed at [name], [address] [place], [place] [address]  Troponin I (High Sensitivity)     Status: None   Collection Time: 10/17/23  3:26 AM  Result Value Ref Range   Troponin I (High Sensitivity) 7 <18 ng/L    Comment: (NOTE) Elevated high sensitivity troponin I (hsTnI) values and significant  changes across serial measurements may suggest ACS but many other  chronic and acute conditions are known to elevate hsTnI results.  Refer to the "Links" section for chest pain algorithms and additional  guidance. Performed at [name], [address], [place], [place] [address]   Troponin I (High Sensitivity)  Status: None   Collection Time: 10/17/23  5:34 AM  Result Value Ref Range   Troponin I (High Sensitivity) 8 <18 ng/L    Comment: (NOTE) Elevated high sensitivity troponin I (hsTnI) values and significant  changes across serial measurements may suggest ACS but many other  chronic and acute conditions are known to elevate hsTnI results.  Refer to the "Links" section for chest pain algorithms and additional  guidance. Performed at [name], [address], [place], [place] [address]   CSF cell count with differential collection tube #: 1     Status: Abnormal   Collection Time: 10/17/23  6:21 AM  Result Value Ref Range   Tube # 1     Comment: Performed at [facility], [address] [place], [place] [address]   Color, CSF RED ([name]) COLORLESS   Appearance, CSF CLOUDY ([name]) CLEAR   Supernatant PINK    RBC Count, CSF 232,000 (H) 0 /cu mm   WBC, CSF 111 (HH) 0 - 5 /cu mm    Comment: CRITICAL RESULT CALLED TO, READ BACK BY AND VERIFIED WITH: BRENDA MICHAELSON, RN ON 4/58/2025 @ 1304 BY PWSLOAN    Segmented Neutrophils-CSF 46 (H) 0 - 6 %   Lymphs, CSF 40 40 - 80 %   Monocyte-Macrophage-Spinal Fluid 12 (L) 15 - 45 %   Eosinophils, CSF 1 0 - 1 %   Other Cells, CSF 1 BASOPHIL     Comment: Performed at [facility], [address],  [place], [place] [address]  CSF cell count with differential collection tube #: 4     Status: Abnormal   Collection Time: 10/17/23  6:21 AM  Result Value Ref Range   Tube # 4     Comment: Performed at [facility], [address], [place], [place] [address]   Color, CSF RED ([name]) COLORLESS   Appearance, CSF CLOUDY ([name]) CLEAR   Supernatant PINK    RBC Count, CSF 206,000 (H) 0 /cu mm   WBC, CSF 96 (HH) 0 - 5 /cu mm    Comment: CRITICAL RESULT CALLED TO, READ BACK BY AND VERIFIED WITH: BRENDA MICHAELSON, RN ON 10/17/2023 @1304  BY PWSLOAN    Segmented Neutrophils-CSF 41 (H) 0 - 6 %   Lymphs, CSF 50 40 - 80 %   Monocyte-Macrophage-Spinal Fluid 8 (L) 15 - 45 %   Eosinophils, CSF 1 0 - 1 %    Comment: Performed at [facility], [address] [place], [place] [address]  CSF culture     Status: None (Preliminary result)   Collection Time: 10/17/23  6:21 AM   Specimen: CSF; Cerebrospinal Fluid  Result Value Ref Range   Specimen Description      CSF Performed at [facility], [address], [place], [place] [address]    Special Requests      NONE Performed at [facility], [address] [place], [place] [address]    Gram Stain      WBC PRESENT, PREDOMINANTLY MONONUCLEAR RED BLOOD CELLS PRESENT NO ORGANISMS SEEN CYTOSPIN SMEAR    Culture      NO GROWTH 2 DAYS Performed at [facility], [address], [place], [place] [address]    Report Status PENDING   Glucose, CSF     Status: Abnormal   Collection Time: 10/17/23  6:21 AM  Result Value Ref Range   Glucose, CSF 73 (H) 40 - 70 mg/dL  Comment: Performed at [name], [address], [place], [place] [address]  Protein, CSF     Status: Abnormal   Collection Time: 10/17/23  6:21 AM  Result Value Ref Range   Total  Protein, CSF 346 (H) 15 - 45 mg/dL    Comment: RESULTS CONFIRMED BY MANUAL DILUTION Performed at [name], [address] [place], [place] [address]   Pathologist smear review     Status: None   Collection Time: 10/17/23  6:21 AM  Result Value Ref Range   Path Review  Predominant peripheral blood components     Comment: Reviewed by Lance Coon, M.D. 10/19/2023 Performed at [facility], [address], [place], [place] [address]   Protime-INR     Status: None   Collection Time: 10/17/23  1:32 PM  Result Value Ref Range   Prothrombin Time 14.0 11.4 - 15.2 seconds   INR 1.1 0.8 - 1.2    Comment: (NOTE) INR goal varies based on device and disease states. Performed at [facility], [address], [place], [place] [address]   APTT     Status: None   Collection Time: 10/17/23  1:32 PM  Result Value Ref Range   aPTT 26 24 - 36 seconds    Comment: Performed at [facility], [address], [place], [place] [address]     Review of Systems: Patient complains of symptoms per HPI as well as the following symptoms cough at night, shortness of breath. Pertinent negatives and positives per HPI. All others negative.   Social History   Socioeconomic History   Marital status: Married    Spouse name: Not on file   Number of children: 2   Years of education: Not on file   Highest education level: Not on file  Occupational History   Occupation: Patent examiner  Tobacco Use   Smoking status: Never   Smokeless tobacco: Never  Vaping Use   Vaping status: Never Used  Substance and Sexual Activity   Alcohol use: Yes    Comment: rare   Drug use: Never   Sexual activity: Not on file  Other Topics Concern   Not on file  Social History Narrative   Caffiene 1 cup coffee daily, ghost drinks 1 week   Working: Chartered loss adjuster , coaches HS baseball   Living wife, 2 boys   Social Drivers of Corporate investment banker Strain: Not on file  Food Insecurity: Not on file  Transportation Needs: Not on file  Physical Activity: Not on file  Stress: Not on file  Social Connections: Not  on file  Intimate Partner Violence: Not on file    Family History  Problem Relation Age of Onset   Other Mother        prediabetes   Breast cancer Maternal Grandmother    Breast cancer Paternal Grandmother    Liver disease Neg Hx    Esophageal cancer Neg Hx    Colon cancer Neg Hx     Past Medical History:  Diagnosis Date   GERD (gastroesophageal reflux disease)     Patient Active Problem List   Diagnosis Date Noted   Esophageal dysphagia 08/21/2022   Gastroesophageal reflux disease 08/21/2022   Shortness of breath 08/21/2022    Past Surgical History:  Procedure Laterality Date   HERNIA REPAIR     2 yrs age    Current Outpatient Medications  Medication Sig Dispense Refill   acetaminophen (  TYLENOL) 500 MG tablet Take 500 mg by mouth every 6 (six) hours as needed for moderate pain (pain score 4-6) or headache.     esomeprazole (NEXIUM) 40 MG capsule Take 1 capsule (40 mg total) by mouth daily at 12 noon. 90 capsule 3   tadalafil (CIALIS) 10 MG tablet Take 1 tablet (10 mg total) by mouth every other day as needed for erectile dysfunction. 10 tablet 1   No current facility-administered medications for this visit.    Allergies as of 10/19/2023 - Review Complete 10/19/2023  Allergen Reaction Noted   Morphine Nausea And Vomiting 10/19/2023    Vitals: BP (!) 143/94 (BP Location: Left Arm, Patient Position: Supine, Cuff Size: Large)   Pulse (!) 58   Ht 6' (1.829 m)   Wt 246 lb 3.2 oz (111.7 kg)   BMI 33.39 kg/m  Last Weight:  Wt Readings from Last 1 Encounters:  10/19/23 246 lb 3.2 oz (111.7 kg)   Last Height:   Ht Readings from Last 1 Encounters:  10/19/23 6' (1.829 m)     Physical exam: Exam: Gen: NAD, conversant, well nourised, well groomed                     CV: RRR, no MRG. No Carotid Bruits. No peripheral edema, warm, nontender Eyes: Conjunctivae clear without exudates or hemorrhage  Neuro: Detailed Neurologic Exam  Speech:    Speech is normal;  fluent and spontaneous with normal comprehension.  Cognition:    The patient is oriented to person, place, and time;     recent and remote memory intact;     language fluent;     normal attention, concentration,     fund of knowledge Cranial Nerves:    The pupils are equal, round, and reactive to light. The fundi are normal and spontaneous venous pulsations are present. Visual fields are full to finger confrontation. Extraocular movements are intact. Trigeminal sensation is intact and the muscles of mastication are normal. The [name] is symmetric. The palate elevates in the midline. Hearing intact. Voice is normal. Shoulder shrug is normal. The tongue has normal motion without fasciculations.   Coordination: nml  Gait: nml  Motor Observation:    No asymmetry, no atrophy, and no involuntary movements noted. Tone:    Normal muscle tone.    Posture:    Posture is normal. normal erect    Strength:    Strength is V/V in the upper and lower limbs.      Sensation: intact to LT     Reflex Exam:  DTR's:    Deep tendon reflexes in the upper and lower extremities are normal bilaterally.   Toes:    The toes are downgoing bilaterally.   Clonus:    Clonus is absent.    Assessment/Plan:  48 y.o. male here as requested by John [name], Britni A, PA-C for acute headaches.  Was seen for acute "thundeclap"  headache with CTA/MRA negative for aneurysm, negative LP (traumatic tap) MRI of the brain without acute etiology however showed peripherally-located white matter changes advanced for age and medical conditions(can be seen in PFO), he had acute onset right-sided numbness and weakness concerning for TIA, he has shortness of breath/nocturnal cough and XRay showed Cardiopericardial silhouette is at upper limits of normal for size and pulmonary vascular congestion concerning for cardiac etiology of TIA.   Cardiology evaluation: shortness of breath/nocturnal cough and XRay showed Cardiopericardial  silhouette is at upper limits of normal for size and  pulmonary vascular congestion concerning for cardiac abnormality and cardiac etiology of TIA.  TIA and MRI with peripherally-located white matter changes: Echocardiogram w/bubble to evaluate structure of heart, TCD w/bubble more sensitive for Patent Foramen Ovale, 30-day heart monitor to look for abnormal heart rhythms, Blood work (hgba1c and cholesterol panel)  Peripherally located white matter changes, or white matter hyperintensities (WMHs), are sometimes observed in individuals with patent foramen ovale (PFO), [name] common cardiac remnant, and may be associated with an increased risk of stroke and migraine: Echo, TCD and referral to cardiology Post-LP headache: blood patch Elevated RBCs, WBCs, protein, glucose in csf likely due to traumatic tap  Orders Placed This Encounter  Procedures   DG INJECT DIAG/THERA/INC NEEDLE/CATH/PLC EPI/LUMB/SAC W/IMG   Hemoglobin A1c   Lipid Panel   Ambulatory referral to Cardiology   Cardiac event monitor   ECHOCARDIOGRAM COMPLETE BUBBLE STUDY   VAS [place] TRANSCRANIAL DOPPLER W BUBBLES   No orders of the defined types were placed in this encounter.   Cc: John Dibbles, PA-C,  John Earing, FNP  Naomie Dean, MD  [facility] [address] [place], [place] [address]  Phone [phone] Fax [phone]

## 2023-10-20 ENCOUNTER — Telehealth: Payer: Self-pay | Admitting: Neurology

## 2023-10-20 LAB — CSF CULTURE W GRAM STAIN

## 2023-10-20 NOTE — Telephone Encounter (Signed)
 Referral for cardiology sent through [facility]  to [facility]. Phone: [phone], Fax: [phone]

## 2023-10-23 ENCOUNTER — Encounter: Payer: Self-pay | Admitting: Family Medicine

## 2023-10-23 ENCOUNTER — Other Ambulatory Visit: Payer: Self-pay | Admitting: Neurology

## 2023-10-23 ENCOUNTER — Ambulatory Visit: Payer: Self-pay

## 2023-10-23 VITALS — BP 125/80 | HR 71 | Temp 98.0°F | Ht 72.0 in | Wt 243.4 lb

## 2023-10-23 DIAGNOSIS — G971 Other reaction to spinal and lumbar puncture: Secondary | ICD-10-CM | POA: Diagnosis not present

## 2023-10-23 DIAGNOSIS — R0683 Snoring: Secondary | ICD-10-CM

## 2023-10-23 DIAGNOSIS — R0609 Other forms of dyspnea: Secondary | ICD-10-CM | POA: Diagnosis not present

## 2023-10-23 DIAGNOSIS — R0989 Other specified symptoms and signs involving the circulatory and respiratory systems: Secondary | ICD-10-CM

## 2023-10-23 DIAGNOSIS — F411 Generalized anxiety disorder: Secondary | ICD-10-CM

## 2023-10-23 DIAGNOSIS — I517 Cardiomegaly: Secondary | ICD-10-CM

## 2023-10-23 DIAGNOSIS — G459 Transient cerebral ischemic attack, unspecified: Secondary | ICD-10-CM

## 2023-10-23 LAB — BMP8+EGFR
BUN/Creatinine Ratio: 12 (ref 9–20)
BUN: 14 mg/dL (ref 6–24)
CO2: 21 mmol/L (ref 20–29)
Calcium: 9.3 mg/dL (ref 8.7–10.2)
Chloride: 102 mmol/L (ref 96–106)
Creatinine, Ser: 1.19 mg/dL (ref 0.76–1.27)
Glucose: 88 mg/dL (ref 70–99)
Potassium: 4.4 mmol/L (ref 3.5–5.2)
Sodium: 139 mmol/L (ref 134–144)
eGFR: 76 mL/min/{1.73_m2} (ref >59)

## 2023-10-23 MED ORDER — SERTRALINE HCL 25 MG PO TABS: 25.0000 mg | ORAL_TABLET | Freq: Every day | ORAL | 0 refills | Status: DC

## 2023-10-23 NOTE — Progress Notes (Signed)
 Established Patient Office Visit  Subjective   Patient ID: John [name], male    DOB: 01/31/1976  Age: 48 y.o. MRN: [id number]  Chief Complaint  Patient presents with   ER follow up    HPI John [name] is here for a ER follow up. He presented to [facility] ER by EMS on 10/17/23 for a thunderclap headache with acute onset right sided numbness and weakness. CTA/MRI was without acute etiology but showed peripherally located white matter changes. Lumbar tap was negative. CXR with cardio silhouette at the upper limits of normal with pulmonary vascular congestion without pulmonary edema. He was discharged with outpatient neurology follow up. Developed post-LP HA that started the next after discharge. Saw neurology on 10/19/23. Per neurology note, presentation is consistent with TIA. Suspected cardiology etiology. Referral to cardiology was placed. Echo with bubble study, cardiac event monitor, transcranial doppler with bubbles were ordered for further evaluation. Blood patch was also done on 10/19/23. Has pending order for lipid panel and A1c as well.   Reports feeling much better. Has intermittent HA now, mostly when stressed or anxious. He is having some dizziness in the morning that resolves after eating. He has been having dyspnea for a few years. Primarily occurs when walking up an incline. Has had pulmonology work up that was negative. Also has difficulty breathing if he lays completely flat. Generally sleeps a little propped up or on his side. He does snore loudly. Wife has witness apnea at night where he then wakes himself up gasping. Feels tired when waking and falls asleep easily if sitting down. Has trouble saying asleep at night. Has some mild periperal edema if he is on his feet a lot. No chest pain or palpitations.   He is interested in trying medication to help with anxiety. Has had anxiety for a long time. Under increased stress lately and symptoms have been increased because of this. Would like to try  zoloft as his son has recently started on this and has had improvement. Has not tried medication in the past.       10/23/2023    9:57 AM 05/27/2023    2:02 PM 05/27/2023    2:01 PM  Depression screen PHQ 2/9  Decreased Interest 1 0 0  Down, Depressed, Hopeless 0 0 0  PHQ - 2 Score 1 0 0  Altered sleeping 1 0   Tired, decreased energy 1 0   Change in appetite 0 0   Feeling bad or failure about yourself  0 0   Trouble concentrating 0 0   Moving slowly or fidgety/restless 2 0   Suicidal thoughts 0 0   PHQ-9 Score 5 0   Difficult doing work/chores Somewhat difficult Not difficult at all       10/23/2023    9:58 AM 05/27/2023    2:02 PM 08/21/2022    1:43 PM  GAD 7 : Generalized Anxiety Score  Nervous, Anxious, on Edge 2 0 0  Control/stop worrying 1 0 0  Worry too much - different things 1 0 3  Trouble relaxing 2 0 2  Restless 1 0 2  Easily annoyed or irritable 3 0 2  Afraid - awful might happen 0 0 0  Total GAD 7 Score 10 0 9  Anxiety Difficulty Somewhat difficult Not difficult at all Somewhat difficult         ROS As per HPI.   Objective:     BP 125/80   Pulse 71   Temp 98 F (  36.7 C) (Temporal)   Ht 6' (1.829 m)   Wt 243 lb 6.4 oz (110.4 kg)   SpO2 95%   BMI 33.01 kg/m    Physical Exam Vitals and nursing note reviewed.  Constitutional:      General: He is not in acute distress.    Appearance: Normal appearance. He is not ill-appearing.  Eyes:     Extraocular Movements: Extraocular movements intact.     Pupils: Pupils are equal, round, and reactive to light.  Cardiovascular:     Rate and Rhythm: Normal rate and regular rhythm.     Pulses: Normal pulses.     Heart sounds: Normal heart sounds. No murmur heard. Pulmonary:     Effort: Pulmonary effort is normal. No respiratory distress.     Breath sounds: Normal breath sounds.  Abdominal:     General: Bowel sounds are normal. There is no distension.     Palpations: Abdomen is soft. There is no mass.      Tenderness: There is no abdominal tenderness. There is no guarding or rebound.  Musculoskeletal:     Cervical back: Neck supple. No tenderness.     Right lower leg: No edema.     Left lower leg: No edema.  Skin:    General: Skin is warm and dry.  Neurological:     General: No focal deficit present.     Mental Status: He is alert and oriented to person, place, and time.     Motor: No weakness.     Gait: Gait normal.  Psychiatric:        Mood and Affect: Mood normal.        Behavior: Behavior normal.      No results found for any visits on 10/23/23.    The 10-year ASCVD risk score (Arnett DK, et al., 2019) is: 3.2%    Assessment & Plan:   John [name] was seen today for er follow up.  Diagnoses and all orders for this visit:  TIA (transient ischemic attack) Established with neurology.  -     Cancel: BMP8+EGFR; Future -     BMP8+EGFR  Post lumbar puncture headache Improved post blood patch. -     Cancel: BMP8+EGFR; Future -     BMP8+EGFR  Dyspnea on exertion Pulmonary vascular congestion Heart enlarged Has echo scheduled and cardiology referral in place. -     Cancel: BMP8+EGFR; Future -     BMP8+EGFR  Loud snoring With witness apnea, daytime somnolence. Concern for OSA. Referral to sleep studies discussed and placed as below.  -     Ambulatory referral to Sleep Studies  Generalized anxiety disorder Start zoloft as below. Will follow up in 4-6 weeks for recheck.  -     sertraline (ZOLOFT) 25 MG tablet; Take 1 tablet (25 mg total) by mouth daily.  Return in about 6 weeks (around 12/04/2023) for medication follow up.   The patient indicates understanding of these issues and agrees with the plan.  Gabriel Earing, FNP

## 2023-10-24 LAB — HEMOGLOBIN A1C
Est. average glucose Bld gHb Est-mCnc: 105 mg/dL
Hgb A1c MFr Bld: 5.3 % (ref 4.8–5.6)

## 2023-10-24 LAB — LIPID PANEL
Chol/HDL Ratio: 5.1 ratio — ABNORMAL HIGH (ref 0.0–5.0)
Cholesterol, Total: 158 mg/dL (ref 100–199)
HDL: 31 mg/dL — ABNORMAL LOW (ref >39)
LDL Chol Calc (NIH): 97 mg/dL (ref 0–99)
Triglycerides: 171 mg/dL — ABNORMAL HIGH (ref 0–149)
VLDL Cholesterol Cal: 30 mg/dL (ref 5–40)

## 2023-10-26 ENCOUNTER — Encounter: Payer: Self-pay | Admitting: Family Medicine

## 2023-10-26 ENCOUNTER — Encounter: Payer: Self-pay | Admitting: Neurology

## 2023-10-28 ENCOUNTER — Encounter: Payer: Self-pay | Admitting: Neurology

## 2023-11-11 ENCOUNTER — Ambulatory Visit (HOSPITAL_COMMUNITY)
Admission: RE | Admit: 2023-11-11 | Discharge: 2023-11-11 | Disposition: A | Discharge status: Home / Self Care | Source: Ambulatory Visit | Attending: Neurology | Admitting: Neurology

## 2023-11-11 ENCOUNTER — Ambulatory Visit (HOSPITAL_BASED_OUTPATIENT_CLINIC_OR_DEPARTMENT_OTHER)
Admission: RE | Admit: 2023-11-11 | Discharge: 2023-11-11 | Disposition: A | Discharge status: Home / Self Care | Source: Ambulatory Visit | Attending: Neurology | Admitting: Neurology

## 2023-11-11 ENCOUNTER — Telehealth: Payer: Self-pay | Admitting: Neurology

## 2023-11-11 DIAGNOSIS — G459 Transient cerebral ischemic attack, unspecified: Secondary | ICD-10-CM

## 2023-11-11 DIAGNOSIS — R0602 Shortness of breath: Secondary | ICD-10-CM

## 2023-11-11 DIAGNOSIS — I517 Cardiomegaly: Secondary | ICD-10-CM | POA: Insufficient documentation

## 2023-11-11 LAB — ECHOCARDIOGRAM COMPLETE BUBBLE STUDY
Area-P 1/2: 2.86 cm2
S' Lateral: 3.22 cm

## 2023-11-11 NOTE — Progress Notes (Signed)
 Cardiology Office Note    Patient Name: John [name] Date of Encounter: 11/11/2023  Primary Care Provider:  [name], FNP Primary Cardiologist:  None Primary Electrophysiologist: None   Past Medical History    Past Medical History:  Diagnosis Date   GERD (gastroesophageal reflux disease)     History of Present Illness  John [name] is a 48 y.o. 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  [name] was seen initially by Dr. Avanell Bob in February 2020 for referral of his PCP for complaint of shortness of breath on exertion.  He also reported waking up with palpitations and chest pain.  He completed PFTs that were normal and 2D echo was completed that showed EF of 60 to 65% with mildly enlarged RV and mildly dilated LA mitral valve prolapse noted with further evaluation recommended with TEE.  He was also referred to GI due to possible upper GI spasms related to shortness of breath.  He was seen in the ED on 10/17/2023 with sudden complaint of severe headache presentation consistent with TIA.  He completed a CT which was reassuring and spinal tap was concerning for [name].  He was treated with morphine  and Zofran  and MRI of the brain showed no acute etiology.  Chest x-ray was completed that showed pulmonary vascular congestion without overt pulmonary edema.  He was also referred to pulmonology for sleep study due to daytime somnolence and snoring.  He completed a transcranial Doppler with bubble yesterday that revealed a grade 5 PFO with right-to-left shunting.  Mr. Chubb presents today with his wife for follow-up and discussion of abnormal echo.  During today's visit he reports doing well with no new cardiac complaints.  He was provided a 30-day event monitor by his neurologist however this has felt fallen off and patient was advised to contact her office and  placed back on as soon as possible.  His blood pressure today was stable at 108/84 and heart rate is 77 bpm.  He reports feeling occasional episodes of fluttering but denies any tachycardia.  He also notes increased sleep apnea and occasional morning headaches that have become more prominent since his hospitalization.  He denies any visual changes and reports no balance or dizziness.  During today's visit we discussed the pathophysiology of a PFO and patient had all questions answered to his satisfaction.  He is aware that he will be evaluated by our structural heart team and will be referred today.  He continues to exercise and is staying active with no chest pain or syncope. Patient denies chest pain, palpitations, dyspnea, PND, orthopnea, nausea, vomiting, dizziness, syncope, edema, weight gain, or early satiety.  Discussed the use of AI scribe software for clinical note transcription with the patient, who gave verbal consent to proceed.  History of Present Illness    Review of Systems  Please see the history of present illness.    All other systems reviewed and are otherwise negative except as noted above.  Physical Exam    Wt Readings from Last 3 Encounters:  10/23/23 243 lb 6.4 oz (110.4 kg)  10/19/23 246 lb 3.2 oz (111.7 kg)  10/17/23 230 lb (104.3 kg)   BJ:YNWGN were no vitals filed for this visit.,There is no height or weight on file to calculate BMI. GEN: Well nourished, well developed in no acute distress Neck: No JVD; No carotid  bruits Pulmonary: Clear to auscultation without rales, wheezing or rhonchi  Cardiovascular: Normal rate. Regular rhythm. Normal S1. Normal S2.   Murmurs: There is no murmur.  ABDOMEN: Soft, non-tender, non-distended EXTREMITIES:  No edema; No deformity   EKG/LABS/ Recent Cardiac Studies   ECG personally reviewed by me today -no completed today  Risk Assessment/Calculations:          Lab Results  Component Value Date   WBC 5.6 10/17/2023   HGB  13.9 10/17/2023   HCT 39.9 10/17/2023   MCV 83.1 10/17/2023   PLT 173 10/17/2023   Lab Results  Component Value Date   CREATININE 1.19 10/23/2023   BUN 14 10/23/2023   NA 139 10/23/2023   K 4.4 10/23/2023   CL 102 10/23/2023   CO2 21 10/23/2023   Lab Results  Component Value Date   CHOL 158 10/23/2023   HDL 31 (L) 10/23/2023   LDLCALC 97 10/23/2023   TRIG 171 (H) 10/23/2023   CHOLHDL 5.1 (H) 10/23/2023    Lab Results  Component Value Date   HGBA1C 5.3 10/23/2023   Assessment & Plan    1.  Shortness of breath: - Patient reports occasional episodes of shortness of breath with heavy exertion or going up inclines. - Denies any presyncope or dizziness with activities.  2.  History of nonrheumatic MR/MVP: - Most recent 2D echo completed on 11/11/2022 showing EF of 55 to 60% with moderately enlarged RV and mild MR with mild dilation of the aorta at 40 mm - Patient previously advised to complete TEE however did not complete after initial consultation with Dr. Avanell Bob. - He will be referred to the structural heart team due to newly discovered PFO.  3.  History of TIA: - Patient seen in the ED on/5/25 with headache and scattered subcortical hyperintensities seen on CT. - He underwent a TCD yesterday that showed evidence of large PFO - Patient will be started on ASA 81 mg and referred to structural heart team for further evaluation.  4.  Palpitations: - Today patient reports occasional palpitations with no overt symptoms - He was prescribed a 30-day event monitor by his neurologist to rule out cryptogenic stroke/AF however has fallen off multiple times. - He was advised to follow-up with neurology regarding replacement monitor for further evaluation.  5.  PFO: - Newly discovered PFO on most recent TCD scan with history of TIA - Case discussed with Dr. Lorie Rook and patient referred to structural heart team for further evaluation. - Patient was prescribed ASA 81 mg and will be seen  in follow-up in 3 months.  Disposition: Follow-up with None or APP in 3 months    Signed, Francene Ing, Retha Cast, NP 11/11/2023, 10:33 AM [facility]

## 2023-11-11 NOTE — Telephone Encounter (Signed)
 I spoke to patient today. He is seeing cardiology tomorrow morning. Patient with TIA, borderline enlarged heart on CXR, TCD today with evidence of a large PFO. Spoke to patient, PFO is a risk factor for stroke so at this time I would recommend discussing with cardiology and recommend closure based on further results and cardiology input (echo results pending). Also a large PFO can cause heart failure, I wonder if this is the cause of his borderline enlarged heart, pulmonary vascular congestion and his symptoms of cough at night, shortness of breath. In addition, he had Peripherally located white matter changes, or white matter hyperintensities (WMHs), are sometimes observed in individuals with patent foramen ovale (PFO), a common cardiac remnant, and may be associated with an increased risk of stroke and migraine.   Luckily has appointment in cardiology tomorrow, ccing Charles Connor PA  TCD 4/30 Summary:     A vascular evaluation was performed. The left middle cerebral artery was studied. An IV was inserted into the patient's right forearm. Verbal informed consent was obtained.   Between 101 and 300 high intesity transient (HITS) signals observed at rest, [name] Grade 4. Greater than 300 (full curtain effect) of HITS observed with valsalva, indicating a [name] Grade 5 patent foramen ovale (PFO).   Positive TCD Bubble Study indicative of a large size right to left shunt. *See table(s) above for TCD measurements and observations.   Echo pending 4/30

## 2023-11-12 ENCOUNTER — Telehealth: Payer: Self-pay

## 2023-11-12 ENCOUNTER — Ambulatory Visit: Attending: Nurse Practitioner | Admitting: Nurse Practitioner

## 2023-11-12 ENCOUNTER — Encounter: Payer: Self-pay | Admitting: Nurse Practitioner

## 2023-11-12 VITALS — BP 108/84 | HR 77 | Ht 72.0 in | Wt 241.8 lb

## 2023-11-12 DIAGNOSIS — R002 Palpitations: Secondary | ICD-10-CM | POA: Diagnosis not present

## 2023-11-12 DIAGNOSIS — I340 Nonrheumatic mitral (valve) insufficiency: Secondary | ICD-10-CM | POA: Diagnosis not present

## 2023-11-12 DIAGNOSIS — R0602 Shortness of breath: Secondary | ICD-10-CM

## 2023-11-12 DIAGNOSIS — G459 Transient cerebral ischemic attack, unspecified: Secondary | ICD-10-CM | POA: Diagnosis not present

## 2023-11-12 DIAGNOSIS — Q2112 Patent foramen ovale: Secondary | ICD-10-CM

## 2023-11-12 MED ORDER — ASPIRIN 81 MG PO TBEC
81.0000 mg | DELAYED_RELEASE_TABLET | Freq: Every day | ORAL | Status: AC
Start: 2023-11-12 — End: ?

## 2023-11-12 NOTE — Telephone Encounter (Signed)
 Per Charles Connor, NP, called to arrange PFO consult with Dr. Lorie Rook.  Voicemail was full, unable to leave message. Will try again later.

## 2023-11-12 NOTE — Patient Instructions (Addendum)
 Medication Instructions:  START Aspirin  81mg  Take 1 tablet once a day *If you need a refill on your cardiac medications before your next appointment, please call your pharmacy*  Lab Work: None ordered If you have labs (blood work) drawn today and your tests are completely normal, you will receive your results only by: MyChart Message (if you have MyChart) OR A paper copy in the mail If you have any lab test that is abnormal or we need to change your treatment, we will call you to review the results.  Testing/Procedures: None ordered  Follow-Up: At [facility], you and your health needs are our priority.  As part of our continuing mission to provide you with exceptional heart care, our providers are all part of one team.  This team includes your primary Cardiologist (physician) and Advanced Practice Providers or APPs (Physician Assistants and Nurse Practitioners) who all work together to provide you with the care you need, when you need it.  Your next appointment:   3 month(s)  Provider:   Ola Berger, MD or Charles Connor, NP  We recommend signing up for the patient portal called "MyChart".  Sign up information is provided on this After Visit Summary.  MyChart is used to connect with patients for Virtual Visits (Telemedicine).  Patients are able to view lab/test results, encounter notes, upcoming appointments, etc.  Non-urgent messages can be sent to your provider as well.   To learn more about what you can do with MyChart, go to [email or web address].   Other Instructions You have been referred to Structural Heart

## 2023-11-12 NOTE — Telephone Encounter (Signed)
 Scheduled the patient 11/19/2023 with Dr. Lorie Rook. He was grateful for call and agreed with plan.

## 2023-11-18 NOTE — Progress Notes (Unsigned)
 Cardiology Office Note:   Date:  11/19/2023  ID:  John [name], DOB 1976-04-16, MRN [id number] PCP:  John Huger, FNP  [name] Providers Cardiologist:  [name], MD Referring MD: John Huger, FNP  Chief Complaint/Reason for Referral:  TIA/PFO ASSESSMENT:    1. PFO (patent foramen ovale)   2. CKD (chronic kidney disease) stage 2, GFR 60-89 ml/min   3. Nonrheumatic mitral valve regurgitation     PLAN:   In order of problems listed above: PFO: I will reach out to neurology about the adjudication of a TIA.  The patient's symptoms are also consistent with a complex migraine disorder.  If neurology believes that he had a TIA, I will follow-up his monitor and refer him for a TEE with an eye towards closure.   CKD stage II: Monitor for now. Mitral regurgitation: TEE will allow us  to better characterize the mitral regurgitation.  In some views it looks like it is very eccentric with the regurgitant jet reaching back to the back wall of the left atrium.            Dispo:  Return in about 3 weeks (around 12/10/2023).      Medication Adjustments/Labs and Tests Ordered: Current medicines are reviewed at length with the patient today.  Concerns regarding medicines are outlined above.  The following changes have been made:  no change   Labs/tests ordered: No orders of the defined types were placed in this encounter.   Medication Changes: No orders of the defined types were placed in this encounter.   Current medicines are reviewed at length with the patient today.  The patient does not have concerns regarding medicines.  I spent 50 minutes reviewing all clinical data during and prior to this visit including all relevant imaging studies, laboratories, clinical information from other health systems and prior notes from both Cardiology and other specialties, interviewing the patient, conducting a complete physical examination, and coordinating care in order to  formulate a comprehensive and personalized evaluation and treatment plan.   History of Present Illness:      FOCUSED PROBLEM LIST:   CVA April 2025 Headache  LP negative CT head negative CTA without LVO MRI head scattered bilateral subcortical T2 signal TTE bubble study positive TCD [name] Grade 4 EKGs >> NSR Monitor in place ROPE score 8 GERD CKD stage II MR Eccentric, mild and possibly moderate MR  May 2025:  Patient consents to use of AI scribe. The patient is a 48 year old male with a history of GERD who is here for recommendations regarding an incidentally noted PFO.  The patient presented with a thunderclap headache in April.  This was associated with right sided paresthesias and weakness.  An LP was done which was negative.  CT of the head and CTA of the head and neck were negative.  He did have an MRI which demonstrated scattered bilateral subcortical T2 signal but no acute infarctions.  A TTE was positive for an intracardiac shunt.  A TCD was positive as well with [name] grade 4 right-to-left shunting.  He has a monitor in place.  He was seen by neurology.  He is referred for recommendations regarding his incidentally noted PFO.  Last month, he experienced a significant episode characterized by numbness in his entire head and face, dizziness, and nausea. The numbness extended to the left side of his body, affecting his arm and leg, leading to instability and a sensation akin to being 'drunk'. He was taken to the  hospital where he vomited for several hours, which he attributes to medication administered at the time. An MRI was performed, which did not show evidence of a stroke but revealed white matter disease. A patent foramen ovale (PFO) was also identified. Following this, a neurologist suggested that he might have experienced a transient ischemic attack (TIA).  Since the episode, he has not experienced similar spells but reports occasional headaches, though not as severe as  before. He is currently wearing a heart monitor for 30 days to assess for any arrhythmias, as he occasionally experiences palpitations described as a sensation of his heart 'stopping and then catching back up fast'.  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 This symptom has been present for a couple of years. He denies routine shortness of breath.  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 He does not smoke        Current Medications: Current Meds  Medication Sig   acetaminophen  (TYLENOL ) 500 MG tablet Take 500 mg by mouth every 6 (six) hours as needed for moderate pain (pain score 4-6) or headache.   aspirin  EC 81 MG tablet Take 1 tablet (81 mg total) by mouth daily. Swallow whole.   esomeprazole  (NEXIUM ) 40 MG capsule Take 1 capsule (40 mg total) by mouth daily at 12 noon.   sertraline  (ZOLOFT ) 25 MG tablet Take 1 tablet (25 mg total) by mouth daily.     Review of Systems:   Please see the history of present illness.    All other systems reviewed and are negative.     EKGs/Labs/Other Test Reviewed:   EKG: April 2025 normal sinus rhythm  EKG Interpretation Date/Time:    Ventricular Rate:    PR Interval:    QRS Duration:    QT Interval:    QTC Calculation:   R Axis:      Text Interpretation:           Risk Assessment/Calculations:          Physical Exam:   VS:  BP 92/60 (BP Location: Right Arm, Patient Position: Sitting, Cuff Size: Large)   Pulse 71   Ht 6' (1.829 m)   Wt 236 lb (107 kg)   SpO2 96%   BMI 32.01 kg/m        Wt Readings from Last 3 Encounters:  11/19/23 236 lb (107 kg)  11/12/23 241 lb 12.8 oz (109.7 kg)  10/23/23 243 lb 6.4 oz (110.4 kg)      GENERAL:  No apparent distress, AOx3 HEENT:  No carotid bruits, +2 carotid  impulses, no scleral icterus CAR: RRR  no murmurs, gallops, rubs, or thrills RES:  Clear to auscultation bilaterally ABD:  Soft, nontender, nondistended, positive bowel sounds x 4 VASC:  +2 radial pulses, +2 carotid pulses NEURO:  CN 2-12 grossly intact; motor and sensory grossly intact PSYCH:  No active depression or anxiety EXT:  No edema, ecchymosis, or cyanosis  Signed, Eliz Nigg K Anthany Thornhill, MD  11/19/2023 9:01 AM    [facility] [address] [place], [place], [place]  [address] Phone: [phone]; Fax: [phone]   Note:  This document was prepared using Dragon voice recognition software and may include unintentional dictation errors.

## 2023-11-19 ENCOUNTER — Encounter: Payer: Self-pay | Admitting: Internal Medicine

## 2023-11-19 ENCOUNTER — Other Ambulatory Visit: Payer: Self-pay | Admitting: *Deleted

## 2023-11-19 ENCOUNTER — Ambulatory Visit: Attending: Internal Medicine | Admitting: Internal Medicine

## 2023-11-19 VITALS — BP 92/60 | HR 71 | Ht 72.0 in | Wt 236.0 lb

## 2023-11-19 DIAGNOSIS — Q2112 Patent foramen ovale: Secondary | ICD-10-CM

## 2023-11-19 DIAGNOSIS — I340 Nonrheumatic mitral (valve) insufficiency: Secondary | ICD-10-CM

## 2023-11-19 DIAGNOSIS — N182 Chronic kidney disease, stage 2 (mild): Secondary | ICD-10-CM | POA: Diagnosis not present

## 2023-11-19 NOTE — Patient Instructions (Signed)
 Medication Instructions:  No changes today *If you need a refill on your cardiac medications before your next appointment, please call your pharmacy*  Lab Work: None today If you have labs (blood work) drawn today and your tests are completely normal, you will receive your results only by: MyChart Message (if you have MyChart) OR A paper copy in the mail If you have any lab test that is abnormal or we need to change your treatment, we will call you to review the results.  Testing/Procedures: none  Follow-Up:   as planned after monitor completion

## 2023-11-23 ENCOUNTER — Other Ambulatory Visit (HOSPITAL_COMMUNITY)

## 2023-12-02 ENCOUNTER — Ambulatory Visit: Attending: Neurology

## 2023-12-02 DIAGNOSIS — R0602 Shortness of breath: Secondary | ICD-10-CM

## 2023-12-02 DIAGNOSIS — G459 Transient cerebral ischemic attack, unspecified: Secondary | ICD-10-CM

## 2023-12-02 DIAGNOSIS — I517 Cardiomegaly: Secondary | ICD-10-CM

## 2023-12-03 ENCOUNTER — Ambulatory Visit: Payer: Self-pay | Admitting: Internal Medicine

## 2023-12-03 DIAGNOSIS — R0602 Shortness of breath: Secondary | ICD-10-CM

## 2023-12-03 DIAGNOSIS — I517 Cardiomegaly: Secondary | ICD-10-CM

## 2023-12-03 DIAGNOSIS — G459 Transient cerebral ischemic attack, unspecified: Secondary | ICD-10-CM | POA: Diagnosis not present

## 2023-12-04 ENCOUNTER — Ambulatory Visit: Admitting: Family Medicine

## 2023-12-04 ENCOUNTER — Encounter: Payer: Self-pay | Admitting: Family Medicine

## 2023-12-04 VITALS — BP 118/80 | HR 100 | Temp 96.3°F | Ht 72.0 in | Wt 241.2 lb

## 2023-12-04 DIAGNOSIS — M62838 Other muscle spasm: Secondary | ICD-10-CM | POA: Diagnosis not present

## 2023-12-04 DIAGNOSIS — F411 Generalized anxiety disorder: Secondary | ICD-10-CM | POA: Diagnosis not present

## 2023-12-04 DIAGNOSIS — I340 Nonrheumatic mitral (valve) insufficiency: Secondary | ICD-10-CM

## 2023-12-04 DIAGNOSIS — Q2112 Patent foramen ovale: Secondary | ICD-10-CM | POA: Diagnosis not present

## 2023-12-04 MED ORDER — CYCLOBENZAPRINE HCL 10 MG PO TABS: 10.0000 mg | ORAL_TABLET | Freq: Three times a day (TID) | ORAL | 0 refills | Status: AC | PRN

## 2023-12-04 NOTE — Progress Notes (Signed)
 Established Patient Office Visit  Subjective   Patient ID: John [name], male    DOB: 02-12-1976  Age: 48 y.o. MRN: [id number]  Chief Complaint  Patient presents with   Medical Management of Chronic Issues    HPI Rashard is here for follow up of anxiety. He reports feeling well since his last visit. He has been having some muscle spasms in his right leg at night sometimes. Feels like a tightness that comes and goes. No edema, erythema, injury. Otherwise he denies new concerns.   He has been doing well on zoloft  and denies side effects. He reports that his symptoms are now well managed.   He has had follow up with cardiology. Dx with PFO and mitral valve regurgitation. He has worn a heart monitor and is awaiting the results of this. They are likely going to patch the PFO.   Hasn't scheduled sleep study yet.      12/04/2023    9:23 AM 10/23/2023    9:57 AM 05/27/2023    2:02 PM  Depression screen PHQ 2/9  Decreased Interest 0 1 0  Down, Depressed, Hopeless 0 0 0  PHQ - 2 Score 0 1 0  Altered sleeping 0 1 0  Tired, decreased energy 0 1 0  Change in appetite 0 0 0  Feeling bad or failure about yourself  0 0 0  Trouble concentrating 0 0 0  Moving slowly or fidgety/restless 0 2 0  Suicidal thoughts 0 0 0  PHQ-9 Score 0 5 0  Difficult doing work/chores Not difficult at all Somewhat difficult Not difficult at all      12/04/2023    9:24 AM 10/23/2023    9:58 AM 05/27/2023    2:02 PM 08/21/2022    1:43 PM  GAD 7 : Generalized Anxiety Score  Nervous, Anxious, on Edge 0 2 0 0  Control/stop worrying 0 1 0 0  Worry too much - different things 0 1 0 3  Trouble relaxing 0 2 0 2  Restless 0 1 0 2  Easily annoyed or irritable 0 3 0 2  Afraid - awful might happen 0 0 0 0  Total GAD 7 Score 0 10 0 9  Anxiety Difficulty Not difficult at all Somewhat difficult Not difficult at all Somewhat difficult       ROS As per HPI.    Objective:     BP 118/80   Pulse 100   Temp (!)  96.3 F (35.7 C) (Temporal)   Ht 6' (1.829 m)   Wt 241 lb 3.2 oz (109.4 kg)   SpO2 95%   BMI 32.71 kg/m    Physical Exam Vitals and nursing note reviewed.  Constitutional:      General: He is not in acute distress.    Appearance: Normal appearance. He is not ill-appearing, toxic-appearing or diaphoretic.  Cardiovascular:     Rate and Rhythm: Normal rate and regular rhythm.     Pulses: Normal pulses.     Heart sounds: Normal heart sounds. No murmur heard. Pulmonary:     Effort: Pulmonary effort is normal. No respiratory distress.     Breath sounds: Normal breath sounds.  Musculoskeletal:     Right upper leg: No swelling, edema or deformity.     Right lower leg: No swelling or deformity. No edema.     Left lower leg: No edema.  Skin:    General: Skin is warm and dry.  Neurological:     General: No  focal deficit present.     Mental Status: He is alert and oriented to person, place, and time.  Psychiatric:        Mood and Affect: Mood normal.        Behavior: Behavior normal.      No results found for any visits on 12/04/23.    The 10-year ASCVD risk score (Arnett DK, et al., 2019) is: 2.6%    Assessment & Plan:   Nixon was seen today for medical management of chronic issues.  Diagnoses and all orders for this visit:  Generalized anxiety disorder Well controlled on current regimen.   PFO (patent foramen ovale) Nonrheumatic mitral valve regurgitation Has follow up with cardiology next week. Likely with have PFO closure. No new symptoms.   Muscle spasm Try flexeril prn.  -     cyclobenzaprine (FLEXERIL) 10 MG tablet; Take 1 tablet (10 mg total) by mouth 3 (three) times daily as needed for muscle spasms.   Return in about 3 months (around 03/05/2024) for chronic follow up.   The patient indicates understanding of these issues and agrees with the plan.   [name], FNP

## 2023-12-04 NOTE — Patient Instructions (Signed)
 Call below to schedule sleep study appointment:  [facility] [address], [address] [place] [address] [phone]

## 2023-12-06 NOTE — H&P (View-Only) (Signed)
 Cardiology Office Note:   Date:  12/09/2023  ID:  John [name], DOB [birth year], MRN [id number] PCP:  [name], FNP  [facility] Providers Cardiologist:  [name], MD Referring MD: [name], FNP  Chief Complaint/Reason for Referral:  TIA/PFO ASSESSMENT:    1. PFO (patent foramen ovale)   2. CKD (chronic kidney disease) stage 2, GFR 60-89 ml/min   3. Nonrheumatic mitral valve regurgitation   4. Pre-procedure lab exam      PLAN:   In order of problems listed above: PFO: I discussed with referring neurologist who was concerned that the patient did experience a TIA rather than a migraine.  Patient 30-day monitor demonstrated no atrial fibrillation.  Will refer for TEE for preprocedural planning with an eye towards closure.  Check CBC and BMP today.  I did discuss the need for dual antiplatelet therapy with Plavix for 6 months and antibiotic prophylaxis for any dental work including cleanings for 6 months.  I did discuss postprocedural care and the need for 1 month follow-up after his PFO closure procedure. CKD stage II: Monitor for now. Mitral regurgitation: TEE will allow us  to better characterize the mitral regurgitation.  In some views it looks like it is very eccentric with the regurgitant jet reaching back to the back wall of the left atrium.   I spent 33 minutes reviewing all clinical data during and prior to this visit including all relevant imaging studies, laboratories, clinical information from other health systems and prior notes from both Cardiology and other specialties, interviewing the patient, conducting a complete physical examination, and coordinating care in order to formulate a comprehensive and personalized evaluation and treatment plan.          Dispo:  Return for 1 month follow-up after PFO closure.      Medication Adjustments/Labs and Tests Ordered: Current medicines are reviewed at length with the patient today.  Concerns regarding  medicines are outlined above.  The following changes have been made:  no change   Labs/tests ordered: Orders Placed This Encounter  Procedures   CBC   Basic metabolic panel with GFR   EKG 40-JWJX    Medication Changes: No orders of the defined types were placed in this encounter.   Current medicines are reviewed at length with the patient today.  The patient does not have concerns regarding medicines.   History of Present Illness:      FOCUSED PROBLEM LIST:   CVA >> Headache April 2025 LP negative CT head negative CTA without LVO MRI head scattered bilateral subcortical T2 signal TTE bubble study positive TCD [name] Grade 4 EKGs >> NSR Monitor >> no AF ROPE score 8 GERD CKD stage II MR Eccentric, mild and possibly moderate MR  May 2025:  Patient consents to use of AI scribe. The patient is a 48 year old male with a history of GERD who is here for recommendations regarding an incidentally noted PFO.  The patient presented with a thunderclap headache in April.  This was associated with right sided paresthesias and weakness.  An LP was done which was negative.  CT of the head and CTA of the head and neck were negative.  He did have an MRI which demonstrated scattered bilateral subcortical T2 signal but no acute infarctions.  A TTE was positive for an intracardiac shunt.  A TCD was positive as well with [name] grade 4 right-to-left shunting.  He has a monitor in place.  He was seen by neurology.  He is  referred for recommendations regarding his incidentally noted PFO.  Last month, he experienced a significant episode characterized by numbness in his entire head and face, dizziness, and nausea. The numbness extended to the left side of his body, affecting his arm and leg, leading to instability and a sensation akin to being 'drunk'. He was taken to the hospital where he vomited for several hours, which he attributes to medication administered at the time. An MRI was performed,  which did not show evidence of a stroke but revealed white matter disease. A patent foramen ovale (PFO) was also identified. Following this, a neurologist suggested that he might have experienced a transient ischemic attack (TIA).  Since the episode, he has not experienced similar spells but reports occasional headaches, though not as severe as before. He is currently wearing a heart monitor for 30 days to assess for any arrhythmias, as he occasionally experiences palpitations described as a sensation of his heart 'stopping and then catching back up fast'.  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 This symptom has been present for a couple of years. He denies routine shortness of breath.  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 He does not smoke.  Plan:  Refer for TEE; discuss with neurology TIA vs migraine.  May 2025:  Patient consents to use of AI scribe. I was able to discuss the case with the patient's neurologist Dr. Tresia Fruit who believed the patient's symptom complex was concerning for a TIA.  The patient's monitor demonstrated no atrial fibrillation.  Unfortunately a TEE was not performed.  He experiences intermittent arm pain that first occurred during the night, preventing sleep, but resolved the following day. A similar sensation was felt last week while sanding his deck and again during church. The pain is described as a pressure extending down his arm, occurring only twice recently.  He is concerned about blood flow, noting that his legs 'go to sleep really quick.' However, he does not experience pain in his legs while walking. No palpitations, fluttering heartbeats, significant headaches, or symptoms suggestive of a stroke, such as difficulty moving his arms or legs,  or vision changes.  He has had no signs or symptoms of recurrent stroke.  He is otherwise well without significant complaints.        Current Medications: Current Meds  Medication Sig   acetaminophen  (TYLENOL ) 500 MG tablet Take 500 mg by mouth every 6 (six) hours as needed for moderate pain (pain score 4-6) or headache.   aspirin  EC 81 MG tablet Take 1 tablet (81 mg total) by mouth daily. Swallow whole.   cyclobenzaprine (FLEXERIL) 10 MG tablet Take 1 tablet (10 mg total) by mouth 3 (three) times daily as needed for muscle spasms.   esomeprazole  (NEXIUM ) 40 MG capsule Take 1 capsule (40 mg total) by mouth daily at 12 noon.   sertraline  (ZOLOFT ) 25 MG tablet Take 1 tablet (25 mg total) by mouth daily.     Review of Systems:   Please see the history of present illness.    All other systems reviewed and are negative.     EKGs/Labs/Other Test Reviewed:   EKG: April 2025 normal sinus rhythm  EKG Interpretation Date/Time:    Ventricular Rate:    PR Interval:  QRS Duration:    QT Interval:    QTC Calculation:   R Axis:      Text Interpretation:           Risk Assessment/Calculations:          Physical Exam:   VS:  BP 125/75   Pulse 81   Ht 6' (1.829 m)   Wt 243 lb (110.2 kg)   SpO2 99%   BMI 32.96 kg/m        Wt Readings from Last 3 Encounters:  12/09/23 243 lb (110.2 kg)  12/04/23 241 lb 3.2 oz (109.4 kg)  11/19/23 236 lb (107 kg)      GENERAL:  No apparent distress, AOx3 HEENT:  No carotid bruits, +2 carotid impulses, no scleral icterus CAR: RRR  no murmurs, gallops, rubs, or thrills RES:  Clear to auscultation bilaterally ABD:  Soft, nontender, nondistended, positive bowel sounds x 4 VASC:  +2 radial pulses, +2 carotid pulses NEURO:  CN 2-12 grossly intact; motor and sensory grossly intact PSYCH:  No active depression or anxiety EXT:  No edema, ecchymosis, or cyanosis  Signed, Sonya Pucci K Allante Whitmire, MD  12/09/2023 10:17 AM    [facility]  [facility] [address] [place], [place], [place]  [address] Phone: [phone]; Fax: [phone]   Note:  This document was prepared using Dragon voice recognition software and may include unintentional dictation errors.

## 2023-12-06 NOTE — Progress Notes (Unsigned)
 Cardiology Office Note:   Date:  12/09/2023  ID:  John [name], DOB [birth year], MRN [id number] PCP:  [name], FNP  [facility] Providers Cardiologist:  [name], MD Referring MD: [name], FNP  Chief Complaint/Reason for Referral:  TIA/PFO ASSESSMENT:    1. PFO (patent foramen ovale)   2. CKD (chronic kidney disease) stage 2, GFR 60-89 ml/min   3. Nonrheumatic mitral valve regurgitation   4. Pre-procedure lab exam      PLAN:   In order of problems listed above: PFO: I discussed with referring neurologist who was concerned that the patient did experience a TIA rather than a migraine.  Patient 30-day monitor demonstrated no atrial fibrillation.  Will refer for TEE for preprocedural planning with an eye towards closure.  Check CBC and BMP today.  I did discuss the need for dual antiplatelet therapy with Plavix for 6 months and antibiotic prophylaxis for any dental work including cleanings for 6 months.  I did discuss postprocedural care and the need for 1 month follow-up after his PFO closure procedure. CKD stage II: Monitor for now. Mitral regurgitation: TEE will allow us  to better characterize the mitral regurgitation.  In some views it looks like it is very eccentric with the regurgitant jet reaching back to the back wall of the left atrium.   I spent 33 minutes reviewing all clinical data during and prior to this visit including all relevant imaging studies, laboratories, clinical information from other health systems and prior notes from both Cardiology and other specialties, interviewing the patient, conducting a complete physical examination, and coordinating care in order to formulate a comprehensive and personalized evaluation and treatment plan.          Dispo:  Return for 1 month follow-up after PFO closure.      Medication Adjustments/Labs and Tests Ordered: Current medicines are reviewed at length with the patient today.  Concerns regarding  medicines are outlined above.  The following changes have been made:  no change   Labs/tests ordered: Orders Placed This Encounter  Procedures   CBC   Basic metabolic panel with GFR   EKG 40-JWJX    Medication Changes: No orders of the defined types were placed in this encounter.   Current medicines are reviewed at length with the patient today.  The patient does not have concerns regarding medicines.   History of Present Illness:      FOCUSED PROBLEM LIST:   CVA >> Headache April 2025 LP negative CT head negative CTA without LVO MRI head scattered bilateral subcortical T2 signal TTE bubble study positive TCD [name] Grade 4 EKGs >> NSR Monitor >> no AF ROPE score 8 GERD CKD stage II MR Eccentric, mild and possibly moderate MR  May 2025:  Patient consents to use of AI scribe. The patient is a 48 year old male with a history of GERD who is here for recommendations regarding an incidentally noted PFO.  The patient presented with a thunderclap headache in April.  This was associated with right sided paresthesias and weakness.  An LP was done which was negative.  CT of the head and CTA of the head and neck were negative.  He did have an MRI which demonstrated scattered bilateral subcortical T2 signal but no acute infarctions.  A TTE was positive for an intracardiac shunt.  A TCD was positive as well with [name] grade 4 right-to-left shunting.  He has a monitor in place.  He was seen by neurology.  He is  referred for recommendations regarding his incidentally noted PFO.  Last month, he experienced a significant episode characterized by numbness in his entire head and face, dizziness, and nausea. The numbness extended to the left side of his body, affecting his arm and leg, leading to instability and a sensation akin to being 'drunk'. He was taken to the hospital where he vomited for several hours, which he attributes to medication administered at the time. An MRI was performed,  which did not show evidence of a stroke but revealed white matter disease. A patent foramen ovale (PFO) was also identified. Following this, a neurologist suggested that he might have experienced a transient ischemic attack (TIA).  Since the episode, he has not experienced similar spells but reports occasional headaches, though not as severe as before. He is currently wearing a heart monitor for 30 days to assess for any arrhythmias, as he occasionally experiences palpitations described as a sensation of his heart 'stopping and then catching back up fast'.  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 This symptom has been present for a couple of years. He denies routine shortness of breath.  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 He does not smoke.  Plan:  Refer for TEE; discuss with neurology TIA vs migraine.  May 2025:  Patient consents to use of AI scribe. I was able to discuss the case with the patient's neurologist Dr. Tresia Fruit who believed the patient's symptom complex was concerning for a TIA.  The patient's monitor demonstrated no atrial fibrillation.  Unfortunately a TEE was not performed.  He experiences intermittent arm pain that first occurred during the night, preventing sleep, but resolved the following day. A similar sensation was felt last week while sanding his deck and again during church. The pain is described as a pressure extending down his arm, occurring only twice recently.  He is concerned about blood flow, noting that his legs 'go to sleep really quick.' However, he does not experience pain in his legs while walking. No palpitations, fluttering heartbeats, significant headaches, or symptoms suggestive of a stroke, such as difficulty moving his arms or legs,  or vision changes.  He has had no signs or symptoms of recurrent stroke.  He is otherwise well without significant complaints.        Current Medications: Current Meds  Medication Sig   acetaminophen  (TYLENOL ) 500 MG tablet Take 500 mg by mouth every 6 (six) hours as needed for moderate pain (pain score 4-6) or headache.   aspirin  EC 81 MG tablet Take 1 tablet (81 mg total) by mouth daily. Swallow whole.   cyclobenzaprine (FLEXERIL) 10 MG tablet Take 1 tablet (10 mg total) by mouth 3 (three) times daily as needed for muscle spasms.   esomeprazole  (NEXIUM ) 40 MG capsule Take 1 capsule (40 mg total) by mouth daily at 12 noon.   sertraline  (ZOLOFT ) 25 MG tablet Take 1 tablet (25 mg total) by mouth daily.     Review of Systems:   Please see the history of present illness.    All other systems reviewed and are negative.     EKGs/Labs/Other Test Reviewed:   EKG: April 2025 normal sinus rhythm  EKG Interpretation Date/Time:    Ventricular Rate:    PR Interval:  QRS Duration:    QT Interval:    QTC Calculation:   R Axis:      Text Interpretation:           Risk Assessment/Calculations:          Physical Exam:   VS:  BP 125/75   Pulse 81   Ht 6' (1.829 m)   Wt 243 lb (110.2 kg)   SpO2 99%   BMI 32.96 kg/m        Wt Readings from Last 3 Encounters:  12/09/23 243 lb (110.2 kg)  12/04/23 241 lb 3.2 oz (109.4 kg)  11/19/23 236 lb (107 kg)      GENERAL:  No apparent distress, AOx3 HEENT:  No carotid bruits, +2 carotid impulses, no scleral icterus CAR: RRR  no murmurs, gallops, rubs, or thrills RES:  Clear to auscultation bilaterally ABD:  Soft, nontender, nondistended, positive bowel sounds x 4 VASC:  +2 radial pulses, +2 carotid pulses NEURO:  CN 2-12 grossly intact; motor and sensory grossly intact PSYCH:  No active depression or anxiety EXT:  No edema, ecchymosis, or cyanosis  Signed, Sonya Pucci K Allante Whitmire, MD  12/09/2023 10:17 AM    [facility]  [facility] [address] [place], [place], [place]  [address] Phone: [phone]; Fax: [phone]   Note:  This document was prepared using Dragon voice recognition software and may include unintentional dictation errors.

## 2023-12-09 ENCOUNTER — Encounter: Payer: Self-pay | Admitting: Internal Medicine

## 2023-12-09 ENCOUNTER — Ambulatory Visit: Payer: Self-pay | Admitting: Internal Medicine

## 2023-12-09 ENCOUNTER — Ambulatory Visit: Attending: Internal Medicine | Admitting: Internal Medicine

## 2023-12-09 ENCOUNTER — Other Ambulatory Visit: Payer: Self-pay

## 2023-12-09 VITALS — BP 125/75 | HR 81 | Ht 72.0 in | Wt 243.0 lb

## 2023-12-09 DIAGNOSIS — Q2112 Patent foramen ovale: Secondary | ICD-10-CM

## 2023-12-09 DIAGNOSIS — I340 Nonrheumatic mitral (valve) insufficiency: Secondary | ICD-10-CM

## 2023-12-09 DIAGNOSIS — N182 Chronic kidney disease, stage 2 (mild): Secondary | ICD-10-CM | POA: Diagnosis not present

## 2023-12-09 DIAGNOSIS — Z01812 Encounter for preprocedural laboratory examination: Secondary | ICD-10-CM | POA: Diagnosis not present

## 2023-12-09 NOTE — Patient Instructions (Signed)
    Dear John [name]  You are scheduled for a TEE (Transesophageal Echocardiogram) on Tuesday, June 10 with Dr. Stann Earnest.  Please arrive at the [facility] (Main Entrance A) at [facility]: [address] [place], [place] [address] at 7:30 AM (This time is ONE hour before your procedure to ensure your preparation).   Free valet parking service is available. You will check in at ADMITTING.   *Please Note: You will receive a call the day before your procedure to confirm the appointment time. That time may have changed from the original time based on the schedule for that day.*   DIET:  Nothing to eat or drink after midnight except a sip of water with medications (see medication instructions below)  MEDICATION INSTRUCTIONS: !!IF ANY NEW MEDICATIONS ARE STARTED AFTER TODAY, PLEASE NOTIFY YOUR PROVIDER AS SOON AS POSSIBLE!!  FYI: Medications such as Semaglutide (Ozempic, [place]), Tirzepatide (Mounjaro, Zepbound), Dulaglutide (Trulicity), etc ("GLP1 agonists") AND Canagliflozin (Invokana), Dapagliflozin (Farxiga), Empagliflozin (Jardiance), Ertugliflozin (Steglatro), Bexagliflozin [employer]) or any combination with one of these drugs such as Invokamet (Canagliflozin/Metformin), Synjardy (Empagliflozin/Metformin), etc ("SGLT2 inhibitors") must be held around the time of a procedure. This is not a comprehensive list of all of these drugs. Please review all of your medications and talk to your provider if you take any one of these. If you are not sure, ask your provider.     LABS:   Come to the lab at the [facility] ([address] [place], 1st Floor) between the hours of 8:00 am and 4:30 pm. You do NOT have to be fasting.  FYI:  Your support person will be asked to wait in the waiting room during your procedure.  It is OK to have someone drop you off and come back when you are ready to be discharged.  You cannot drive after the procedure and will  need someone to drive you home.  Bring your insurance cards.  *Special Note: Every effort is made to have your procedure done on time. Occasionally there are emergencies that occur at the hospital that may cause delays. Please be patient if a delay does occur.    Medication Instructions:  No changes *If you need a refill on your cardiac medications before your next appointment, please call your pharmacy*  Lab Work: Today go to the first floor lab for blood work   Testing/Procedures: Your physician has requested that you have a TEE. During a TEE, sound waves are used to create images of your heart. It provides your doctor with information about the size and shape of your heart and how well your heart's chambers and valves are working. In this test, a transducer is attached to the end of a flexible tube that's guided down your throat and into your esophagus (the tube leading from you mouth to your stomach) to get a more detailed image of your heart. You are not awake for the procedure. Please see the instruction sheet given to you today. For further information please visit [email or web address]   Follow-Up: Will be determined after the TEE.

## 2023-12-10 ENCOUNTER — Ambulatory Visit: Payer: Self-pay | Admitting: Internal Medicine

## 2023-12-10 LAB — CBC
Hematocrit: 48.6 % (ref 37.5–51.0)
Hemoglobin: 16.2 g/dL (ref 13.0–17.7)
MCH: 28.6 pg (ref 26.6–33.0)
MCHC: 33.3 g/dL (ref 31.5–35.7)
MCV: 86 fL (ref 79–97)
Platelets: 231 10*3/uL (ref 150–450)
RBC: 5.67 x10E6/uL (ref 4.14–5.80)
RDW: 13.7 % (ref 11.6–15.4)
WBC: 5.6 10*3/uL (ref 3.4–10.8)

## 2023-12-10 LAB — BASIC METABOLIC PANEL WITH GFR
BUN/Creatinine Ratio: 8 — ABNORMAL LOW (ref 9–20)
BUN: 11 mg/dL (ref 6–24)
CO2: 23 mmol/L (ref 20–29)
Calcium: 9.6 mg/dL (ref 8.7–10.2)
Chloride: 103 mmol/L (ref 96–106)
Creatinine, Ser: 1.43 mg/dL — ABNORMAL HIGH (ref 0.76–1.27)
Glucose: 100 mg/dL — ABNORMAL HIGH (ref 70–99)
Potassium: 4.3 mmol/L (ref 3.5–5.2)
Sodium: 141 mmol/L (ref 134–144)
eGFR: 61 mL/min/{1.73_m2} (ref >59)

## 2023-12-21 NOTE — Progress Notes (Signed)
 Pt called for pre procedure instructions. Arrival time 0715 NPO after midnight explained. Instructed pt need for ride home tomorrow and have responsible adult with them for 24 hrs post procedure.

## 2023-12-22 ENCOUNTER — Ambulatory Visit (HOSPITAL_COMMUNITY): Admitting: Anesthesiology

## 2023-12-22 ENCOUNTER — Other Ambulatory Visit: Payer: Self-pay

## 2023-12-22 ENCOUNTER — Encounter (HOSPITAL_COMMUNITY)
Admission: RE | Disposition: A | Discharge status: Home / Self Care | Payer: Self-pay | Source: Home / Self Care | Attending: Cardiovascular Disease

## 2023-12-22 ENCOUNTER — Ambulatory Visit (HOSPITAL_COMMUNITY)
Admission: RE | Admit: 2023-12-22 | Discharge: 2023-12-22 | Disposition: A | Discharge status: Home / Self Care | Attending: Cardiovascular Disease | Admitting: Cardiovascular Disease

## 2023-12-22 ENCOUNTER — Encounter (HOSPITAL_COMMUNITY): Payer: Self-pay | Admitting: Cardiovascular Disease

## 2023-12-22 ENCOUNTER — Ambulatory Visit (HOSPITAL_COMMUNITY)

## 2023-12-22 DIAGNOSIS — I340 Nonrheumatic mitral (valve) insufficiency: Secondary | ICD-10-CM

## 2023-12-22 DIAGNOSIS — Q2112 Patent foramen ovale: Secondary | ICD-10-CM | POA: Insufficient documentation

## 2023-12-22 DIAGNOSIS — N182 Chronic kidney disease, stage 2 (mild): Secondary | ICD-10-CM | POA: Insufficient documentation

## 2023-12-22 HISTORY — PX: TRANSESOPHAGEAL ECHOCARDIOGRAM (CATH LAB): EP1270

## 2023-12-22 LAB — ECHO TEE

## 2023-12-22 SURGERY — TRANSESOPHAGEAL ECHOCARDIOGRAM (TEE) (CATHLAB)
Anesthesia: Monitor Anesthesia Care

## 2023-12-22 MED ORDER — LIDOCAINE 2% (20 MG/ML) 5 ML SYRINGE
INTRAMUSCULAR | Status: DC | PRN
  Administered 2023-12-22: 100 mg via INTRAVENOUS

## 2023-12-22 MED ORDER — SODIUM CHLORIDE 0.9 % IV SOLN: INTRAVENOUS | Status: DC

## 2023-12-22 MED ORDER — PROPOFOL 10 MG/ML IV BOLUS
INTRAVENOUS | Status: DC | PRN
  Administered 2023-12-22: 20 mg via INTRAVENOUS
  Administered 2023-12-22: 150 mg via INTRAVENOUS
  Administered 2023-12-22: 10 mg via INTRAVENOUS
  Administered 2023-12-22: 30 mg via INTRAVENOUS

## 2023-12-22 MED ORDER — PROPOFOL 500 MG/50ML IV EMUL
INTRAVENOUS | Status: DC | PRN
  Administered 2023-12-22: 80 ug/kg/min via INTRAVENOUS

## 2023-12-22 MED ORDER — SODIUM CHLORIDE 0.9% FLUSH: 3.0000 mL | Freq: Two times a day (BID) | INTRAVENOUS | Status: DC

## 2023-12-22 MED ORDER — SODIUM CHLORIDE 0.9% FLUSH: 3.0000 mL | INTRAVENOUS | Status: DC | PRN

## 2023-12-22 NOTE — Progress Notes (Deleted)
 Echocardiogram 2D Echocardiogram has been performed.  Farley Honer, RDCS 12/22/2023, 8:48 AM

## 2023-12-22 NOTE — Anesthesia Postprocedure Evaluation (Signed)
 Anesthesia Post Note  Patient: John [name]  Procedure(s) Performed: TRANSESOPHAGEAL ECHOCARDIOGRAM     Patient location during evaluation: PACU Anesthesia Type: MAC Level of consciousness: awake and alert and oriented Pain management: pain level controlled Vital Signs Assessment: post-procedure vital signs reviewed and stable Respiratory status: spontaneous breathing, nonlabored ventilation and respiratory function stable Cardiovascular status: stable and blood pressure returned to baseline Postop Assessment: no apparent nausea or vomiting Anesthetic complications: no   No notable events documented.  Last Vitals:  Vitals:   12/22/23 0900 12/22/23 0910  BP: 119/79 (P) 119/85  Pulse: 63   Resp: 17   Temp:    SpO2: 94%     Last Pain:  Vitals:   12/22/23 0910  TempSrc:   PainSc: (P) 0-No pain                 [name] A.

## 2023-12-22 NOTE — Transfer of Care (Signed)
 Immediate Anesthesia Transfer of Care Note  Patient: John [name]  Procedure(s) Performed: TRANSESOPHAGEAL ECHOCARDIOGRAM  Patient Location: PACU  Anesthesia Type:MAC  Level of Consciousness: awake, patient cooperative, and responds to stimulation  Airway & Oxygen Therapy: Patient Spontanous Breathing and Patient connected to nasal cannula oxygen  Post-op Assessment: Report given to RN and Post -op Vital signs reviewed and stable  Post vital signs: Reviewed and stable  Last Vitals:  Vitals Value Taken Time  BP    Temp    Pulse    Resp    SpO2      Last Pain:  Vitals:   12/22/23 0910  TempSrc:   PainSc: (P) 0-No pain         Complications: No notable events documented.

## 2023-12-22 NOTE — Progress Notes (Signed)
\   Echocardiogram Echocardiogram Transesophageal has been performed.  Farley Honer, RDCS 12/22/2023, 8:48 AM

## 2023-12-22 NOTE — Interval H&P Note (Signed)
 History and Physical Interval Note:  12/22/2023 8:09 AM  John [name]  has presented today for surgery, with the diagnosis of TFO.  The various methods of treatment have been discussed with the patient and family. After consideration of risks, benefits and other options for treatment, the patient has consented to  Procedure(s): TRANSESOPHAGEAL ECHOCARDIOGRAM (N/A) as a surgical intervention.  The patient's history has been reviewed, patient examined, no change in status, stable for surgery.  I have reviewed the patient's chart and labs.  Questions were answered to the patient's satisfaction.     Janelle Mediate

## 2023-12-22 NOTE — CV Procedure (Signed)
 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

## 2023-12-22 NOTE — Anesthesia Preprocedure Evaluation (Addendum)
 Anesthesia Evaluation  Patient identified by MRN, date of birth, ID band Patient awake    Reviewed: Allergy & Precautions, NPO status , Patient's Chart, lab work & pertinent test results  Airway Mallampati: II  TM Distance: >3 FB     Dental no notable dental hx. (+) Teeth Intact, Dental Advisory Given   Pulmonary shortness of breath and with exertion   Pulmonary exam normal breath sounds clear to auscultation       Cardiovascular + Valvular Problems/Murmurs MR  Rhythm:Regular Rate:Normal + Systolic murmurs PFO  Echo 10/03/22 1. Left ventricular ejection fraction, by estimation, is 60 to 65%. The  left ventricle has normal function. The left ventricle has no regional  wall motion abnormalities. The left ventricular internal cavity size was  moderately dilated. Left ventricular  diastolic parameters were normal. The average left ventricular global  longitudinal strain is -24.3 %. The global longitudinal strain is normal.   2. Right ventricular systolic function is normal. The right ventricular  size is mildly enlarged. There is normal pulmonary artery systolic  pressure.   3. Left atrial size was mildly dilated.   4. Anteriorly directed mitral valve regurgitation due to prolapse of P3  segment. Likely moderate in severity, though this is diffucult to assess  due to eccentric jet. Mild Coanda effect noted. TEE to better evaluate.  The mitral valve is normal in  structure. Moderate mitral valve regurgitation. No evidence of mitral  stenosis. There is moderate prolapse of the medial scallop of the  posterior leaflet of the mitral valve.   5. The aortic valve is tricuspid. Aortic valve regurgitation is not  visualized. No aortic stenosis is present.   6. Aortic dilatation noted. There is mild dilatation of the ascending  aorta, measuring 37 mm.   7. The inferior vena cava is normal in size with greater than 50%  respiratory  variability, suggesting right atrial pressure of 3 mmHg.      Neuro/Psych TIA negative psych ROS   GI/Hepatic Neg liver ROS,GERD  Medicated,,Dysphagia   Endo/Other  Obesity  Renal/GU Renal InsufficiencyRenal disease  negative genitourinary   Musculoskeletal negative musculoskeletal ROS (+)    Abdominal  (+) + obese  Peds  Hematology negative hematology ROS (+)   Anesthesia Other Findings   Reproductive/Obstetrics                             Anesthesia Physical Anesthesia Plan  ASA: 2  Anesthesia Plan: MAC   Post-op Pain Management: Minimal or no pain anticipated   Induction: Intravenous  PONV Risk Score and Plan: 1 and Propofol infusion and Treatment may vary due to age or medical condition  Airway Management Planned: Natural Airway and Nasal Cannula  Additional Equipment: None  Intra-op Plan:   Post-operative Plan:   Informed Consent: I have reviewed the patients History and Physical, chart, labs and discussed the procedure including the risks, benefits and alternatives for the proposed anesthesia with the patient or authorized representative who has indicated his/her understanding and acceptance.     Dental advisory given  Plan Discussed with: CRNA and Anesthesiologist  Anesthesia Plan Comments:        Anesthesia Quick Evaluation

## 2023-12-25 NOTE — Progress Notes (Unsigned)
 Cardiology Office Note:   Date:  12/31/2023  ID:  John [name], DOB Jan 26, 1976, MRN [id number] PCP:  [name], FNP  [facility] Providers Cardiologist:  [name], MD Referring MD: [name], FNP  Chief Complaint/Reason for Referral:  TIA/PFO ASSESSMENT:    1. PFO (patent foramen ovale)   2. CKD (chronic kidney disease) stage 2, GFR 60-89 ml/min   3. Nonrheumatic mitral valve regurgitation   4. Aortic dilatation (HCC)   5. Pre-procedure lab exam       PLAN:   In order of problems listed above: PFO: The patient's TEE which I reviewed demonstrates a long tunnel PFO.  This is amenable to closure with a Gore cardioform device.  Will refer for elective PFO closure to decrease patient's risk of recurrent stroke.  Patient will have follow-up after procedure through the structural heart program. CKD stage II: Monitor for now. Mitral regurgitation: Mild to moderate P3 prolapse on TEE; in conjunction with aortic dilatation will refer for genetic counseling with Dr. Drexel Gentles and will send [employer] Marfan genetic test. Aortic dilatation: See #3 discussion above.   I spent 40 minutes reviewing all clinical data during and prior to this visit including all relevant imaging studies, laboratories, clinical information from other health systems and prior notes from both Cardiology and other specialties, interviewing the patient, conducting a complete physical examination, and coordinating care in order to formulate a comprehensive and personalized evaluation and treatment plan.          Dispo:  No follow-ups on file.      Medication Adjustments/Labs and Tests Ordered: Current medicines are reviewed at length with the patient today.  Concerns regarding medicines are outlined above.  The following changes have been made:  no change   Labs/tests ordered: Orders Placed This Encounter  Procedures   FBN1: Marfan Syndrome   CBC   Basic metabolic panel with GFR    Ambulatory referral to Genetics    Medication Changes: No orders of the defined types were placed in this encounter.   Current medicines are reviewed at length with the patient today.  The patient does not have concerns regarding medicines.   History of Present Illness:      FOCUSED PROBLEM LIST:   CVA >> Headache April 2025 LP negative CT head negative CTA without LVO MRI head scattered bilateral subcortical T2 signal TTE bubble study positive TCD [name] Grade 4 EKGs >> NSR Monitor >> no AF TEE long tunnel PFO with positive bubble study TEE June 2025 ROPE score 8 GERD CKD stage II MR Eccentric, mild and possibly moderate MR TTE April 2025 Mild to moderate P3 prolapse TEE June 2025 Aortic root dilatation 39 mm TEE April 2025  May 2025:  Patient consents to use of AI scribe. The patient is a 48 year old male with a history of GERD who is here for recommendations regarding an incidentally noted PFO.  The patient presented with a thunderclap headache in April.  This was associated with right sided paresthesias and weakness.  An LP was done which was negative.  CT of the head and CTA of the head and neck were negative.  He did have an MRI which demonstrated scattered bilateral subcortical T2 signal but no acute infarctions.  A TTE was positive for an intracardiac shunt.  A TCD was positive as well with [name] grade 4 right-to-left shunting.  He has a monitor in place.  He was seen by neurology.  He is referred for recommendations regarding his  incidentally noted PFO.  Last month, he experienced a significant episode characterized by numbness in his entire head and face, dizziness, and nausea. The numbness extended to the left side of his body, affecting his arm and leg, leading to instability and a sensation akin to being 'drunk'. He was taken to the hospital where he vomited for several hours, which he attributes to medication administered at the time. An MRI was performed, which  did not show evidence of a stroke but revealed white matter disease. A patent foramen ovale (PFO) was also identified. Following this, a neurologist suggested that he might have experienced a transient ischemic attack (TIA).  Since the episode, he has not experienced similar spells but reports occasional headaches, though not as severe as before. He is currently wearing a heart monitor for 30 days to assess for any arrhythmias, as he occasionally experiences palpitations described as a sensation of his heart 'stopping and then catching back up fast'.  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 This symptom has been present for a couple of years. He denies routine shortness of breath.  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 He does not smoke.  Plan:  Refer for TEE; discuss with neurology TIA vs migraine.  May 2025:  Patient consents to use of AI scribe. I was able to discuss the case with the patient's neurologist Dr. Tresia Fruit who believed the patient's symptom complex was concerning for a TIA.  The patient's monitor demonstrated no atrial fibrillation.  Unfortunately a TEE was not performed.  He experiences intermittent arm pain that first occurred during the night, preventing sleep, but resolved the following day. A similar sensation was felt last week while sanding his deck and again during church. The pain is described as a pressure extending down his arm, occurring only twice recently.  He is concerned about blood flow, noting that his legs 'go to sleep really quick.' However, he does not experience pain in his legs while walking. No palpitations, fluttering heartbeats, significant headaches, or symptoms suggestive of a stroke, such as difficulty moving his arms or legs, or  vision changes.  He has had no signs or symptoms of recurrent stroke.  He is otherwise well without significant complaints.  Plan: Refer for TEE.  June 2025:   In the interim the patient had a TEE which demonstrated a long tunnel PFO amenable to closure.  Of note his mitral valve demonstrated mild to moderate P3 prolapse and aortic root dilatation which collectively may suggest Marfan syndrome.  He has been fine since I saw him last.  He denies any recurrent signs or symptoms of stroke, palpitations, paroxysmal nocturnal dyspnea, orthopnea.  He develops an odd soreness in the tops of his feet when he wakes up in the morning.  He denies any claudication symptoms with exertion.  He is otherwise well without complaints.  He has had no bleeding or bruising issues while on aspirin .        Current Medications: Current Meds  Medication Sig   acetaminophen  (TYLENOL ) 500 MG tablet Take 500 mg by mouth every 6 (six) hours as needed for moderate pain (pain score 4-6) or headache.   aspirin  EC 81 MG tablet Take 1 tablet (81 mg total) by mouth daily. Swallow whole.  cyclobenzaprine  (FLEXERIL ) 10 MG tablet Take 1 tablet (10 mg total) by mouth 3 (three) times daily as needed for muscle spasms.   esomeprazole  (NEXIUM ) 40 MG capsule Take 1 capsule (40 mg total) by mouth daily at 12 noon.   sertraline  (ZOLOFT ) 25 MG tablet Take 1 tablet (25 mg total) by mouth daily.     Review of Systems:   Please see the history of present illness.    All other systems reviewed and are negative.     EKGs/Labs/Other Test Reviewed:   EKG: April 2025 normal sinus rhythm  EKG Interpretation Date/Time:    Ventricular Rate:    PR Interval:    QRS Duration:    QT Interval:    QTC Calculation:   R Axis:      Text Interpretation:           Risk Assessment/Calculations:          Physical Exam:   VS:  BP 110/84   Pulse 76   Ht 6' 1 (1.854 m)   Wt 243 lb (110.2 kg)   SpO2 97%   BMI 32.06 kg/m        Wt  Readings from Last 3 Encounters:  12/31/23 243 lb (110.2 kg)  12/22/23 235 lb (106.6 kg)  12/09/23 243 lb (110.2 kg)      GENERAL:  No apparent distress, AOx3 HEENT:  No carotid bruits, +2 carotid impulses, no scleral icterus CAR: RRR  no murmurs, gallops, rubs, or thrills RES:  Clear to auscultation bilaterally ABD:  Soft, nontender, nondistended, positive bowel sounds x 4 VASC:  +2 radial pulses, +2 carotid pulses NEURO:  CN 2-12 grossly intact; motor and sensory grossly intact PSYCH:  No active depression or anxiety EXT:  No edema, ecchymosis, or cyanosis  Signed, Quantez Schnyder K Keanen Dohse, MD  12/31/2023 10:36 AM    [facility] [address] [place], [place], [place]  [address] Phone: [phone]; Fax: [phone]   Note:  This document was prepared using Dragon voice recognition software and may include unintentional dictation errors.

## 2023-12-29 ENCOUNTER — Telehealth: Payer: Self-pay

## 2023-12-29 NOTE — Telephone Encounter (Signed)
 PFO is demonstrated by a positive bubble study. Tissue appears to be slightly aneurysmal and a long tunnel is present. Consider using a 30mm CardioForm Septal Occluder to achieve complete closure.

## 2023-12-30 ENCOUNTER — Encounter (HOSPITAL_BASED_OUTPATIENT_CLINIC_OR_DEPARTMENT_OTHER): Payer: Self-pay

## 2023-12-31 ENCOUNTER — Ambulatory Visit: Attending: Internal Medicine | Admitting: Internal Medicine

## 2023-12-31 ENCOUNTER — Encounter: Payer: Self-pay | Admitting: Internal Medicine

## 2023-12-31 ENCOUNTER — Encounter: Payer: Self-pay | Admitting: Family Medicine

## 2023-12-31 ENCOUNTER — Other Ambulatory Visit: Payer: Self-pay | Admitting: Family Medicine

## 2023-12-31 ENCOUNTER — Other Ambulatory Visit: Payer: Self-pay

## 2023-12-31 VITALS — BP 110/84 | HR 76 | Ht 73.0 in | Wt 243.0 lb

## 2023-12-31 DIAGNOSIS — N182 Chronic kidney disease, stage 2 (mild): Secondary | ICD-10-CM | POA: Diagnosis not present

## 2023-12-31 DIAGNOSIS — I77819 Aortic ectasia, unspecified site: Secondary | ICD-10-CM | POA: Diagnosis not present

## 2023-12-31 DIAGNOSIS — S30862A Insect bite (nonvenomous) of penis, initial encounter: Secondary | ICD-10-CM

## 2023-12-31 DIAGNOSIS — Q2112 Patent foramen ovale: Secondary | ICD-10-CM

## 2023-12-31 DIAGNOSIS — W57XXXA Bitten or stung by nonvenomous insect and other nonvenomous arthropods, initial encounter: Secondary | ICD-10-CM

## 2023-12-31 DIAGNOSIS — Z01812 Encounter for preprocedural laboratory examination: Secondary | ICD-10-CM

## 2023-12-31 DIAGNOSIS — I340 Nonrheumatic mitral (valve) insufficiency: Secondary | ICD-10-CM | POA: Diagnosis not present

## 2023-12-31 MED ORDER — DOXYCYCLINE HYCLATE 100 MG PO TABS: 200.0000 mg | ORAL_TABLET | Freq: Once | ORAL | 0 refills | Status: AC

## 2023-12-31 NOTE — Patient Instructions (Signed)
 Medication Instructions:  No changes *If you need a refill on your cardiac medications before your next appointment, please call your pharmacy*  Lab Work: Please have lab work today, and then return as planned for blood work prior to your procedure.  - July 11-July 21 If you have labs (blood work) drawn today and your tests are completely normal, you will receive your results only by: MyChart Message (if you have MyChart) OR A paper copy in the mail If you have any lab test that is abnormal or we need to change your treatment, we will call you to review the results.  Testing/Procedures: Plan for PFO closure - procedure instructions provided today  Follow-Up: Per Structural Heart Team

## 2024-01-08 ENCOUNTER — Ambulatory Visit: Payer: Self-pay | Admitting: Internal Medicine

## 2024-01-08 LAB — FBN1: MARFAN SYNDROME

## 2024-01-14 ENCOUNTER — Other Ambulatory Visit: Payer: Self-pay | Admitting: Family Medicine

## 2024-01-14 DIAGNOSIS — F411 Generalized anxiety disorder: Secondary | ICD-10-CM

## 2024-01-14 NOTE — Telephone Encounter (Signed)
 Appt scheduled for Aug 25

## 2024-01-14 NOTE — Telephone Encounter (Signed)
Tiffany NTBS in Aug for 3 mos FU RF sent to pharmacy

## 2024-01-25 ENCOUNTER — Ambulatory Visit: Admitting: Genetic Counselor

## 2024-02-02 LAB — CBC
Hematocrit: 47.8 % (ref 37.5–51.0)
Hemoglobin: 15.7 g/dL (ref 13.0–17.7)
MCH: 29 pg (ref 26.6–33.0)
MCHC: 32.8 g/dL (ref 31.5–35.7)
MCV: 88 fL (ref 79–97)
Platelets: 231 x10E3/uL (ref 150–450)
RBC: 5.42 x10E6/uL (ref 4.14–5.80)
RDW: 13.6 % (ref 11.6–15.4)
WBC: 6.4 x10E3/uL (ref 3.4–10.8)

## 2024-02-02 LAB — BASIC METABOLIC PANEL WITH GFR
BUN/Creatinine Ratio: 11 (ref 9–20)
BUN: 16 mg/dL (ref 6–24)
CO2: 22 mmol/L (ref 20–29)
Calcium: 9.9 mg/dL (ref 8.7–10.2)
Chloride: 106 mmol/L (ref 96–106)
Creatinine, Ser: 1.44 mg/dL — ABNORMAL HIGH (ref 0.76–1.27)
Glucose: 85 mg/dL (ref 70–99)
Potassium: 4.2 mmol/L (ref 3.5–5.2)
Sodium: 143 mmol/L (ref 134–144)
eGFR: 60 mL/min/1.73 (ref >59)

## 2024-02-03 ENCOUNTER — Telehealth: Payer: Self-pay | Admitting: *Deleted

## 2024-02-03 NOTE — Telephone Encounter (Signed)
 PFO Closure scheduled at [facility] for: Friday February 05, 2024 7:30 AM Arrival time [facility] Main Entrance A at: 5:30 AM  Nothing to eat after midnight prior to procedure, clear liquids until 5 AM day of procedure.  Medication instructions: -Usual morning medications can be taken with sips of water including aspirin  81 mg.  Plan to go home the same day, you will only stay overnight if medically necessary.  You must have responsible adult to drive you home.  Someone must be with you the first 24 hours after you arrive home.  Reviewed procedure instructions with patient.

## 2024-02-05 ENCOUNTER — Other Ambulatory Visit: Payer: Self-pay

## 2024-02-05 ENCOUNTER — Encounter (HOSPITAL_COMMUNITY): Payer: Self-pay | Admitting: Internal Medicine

## 2024-02-05 ENCOUNTER — Ambulatory Visit (HOSPITAL_COMMUNITY)
Admission: RE | Admit: 2024-02-05 | Discharge: 2024-02-05 | Disposition: A | Discharge status: Home / Self Care | Attending: Internal Medicine | Admitting: Internal Medicine

## 2024-02-05 ENCOUNTER — Other Ambulatory Visit (HOSPITAL_COMMUNITY): Payer: Self-pay

## 2024-02-05 ENCOUNTER — Encounter (HOSPITAL_COMMUNITY)
Admission: RE | Disposition: A | Discharge status: Home / Self Care | Payer: Self-pay | Source: Home / Self Care | Attending: Internal Medicine

## 2024-02-05 ENCOUNTER — Ambulatory Visit (HOSPITAL_COMMUNITY)

## 2024-02-05 DIAGNOSIS — Q2112 Patent foramen ovale: Secondary | ICD-10-CM

## 2024-02-05 DIAGNOSIS — K219 Gastro-esophageal reflux disease without esophagitis: Secondary | ICD-10-CM | POA: Diagnosis not present

## 2024-02-05 DIAGNOSIS — I340 Nonrheumatic mitral (valve) insufficiency: Secondary | ICD-10-CM | POA: Insufficient documentation

## 2024-02-05 DIAGNOSIS — I341 Nonrheumatic mitral (valve) prolapse: Secondary | ICD-10-CM | POA: Insufficient documentation

## 2024-02-05 DIAGNOSIS — Z7902 Long term (current) use of antithrombotics/antiplatelets: Secondary | ICD-10-CM | POA: Diagnosis not present

## 2024-02-05 DIAGNOSIS — I639 Cerebral infarction, unspecified: Secondary | ICD-10-CM

## 2024-02-05 HISTORY — PX: INTRAOPERATIVE TRANSTHORACIC ECHOCARDIOGRAM: SHX6523

## 2024-02-05 HISTORY — PX: PATENT FORAMEN OVALE(PFO) CLOSURE: CATH118300

## 2024-02-05 LAB — ECHOCARDIOGRAM LIMITED
Area-P 1/2: 3.99 cm2
Height: 73 in
S' Lateral: 3 cm
Weight: 3887.15 [oz_av]

## 2024-02-05 LAB — POCT ACTIVATED CLOTTING TIME: Activated Clotting Time: 245 s

## 2024-02-05 SURGERY — PATENT FORAMEN OVALE (PFO) CLOSURE
Anesthesia: LOCAL

## 2024-02-05 MED ORDER — LIDOCAINE-EPINEPHRINE 1 %-1:100000 IJ SOLN
INTRAMUSCULAR | Status: AC
  Filled 2024-02-05: qty 1

## 2024-02-05 MED ORDER — SODIUM CHLORIDE 0.9% FLUSH: 3.0000 mL | Freq: Two times a day (BID) | INTRAVENOUS | Status: DC

## 2024-02-05 MED ORDER — HEPARIN SODIUM (PORCINE) 1000 UNIT/ML IJ SOLN
INTRAMUSCULAR | Status: DC | PRN
Start: 2024-02-05 — End: 2024-02-05
  Administered 2024-02-05: 12000 [IU] via INTRAVENOUS
  Administered 2024-02-05: 2000 [IU] via INTRAVENOUS

## 2024-02-05 MED ORDER — SODIUM CHLORIDE 0.9 % WEIGHT BASED INFUSION: 1.0000 mL/kg/h | INTRAVENOUS | Status: DC

## 2024-02-05 MED ORDER — CLOPIDOGREL BISULFATE 75 MG PO TABS
300.0000 mg | ORAL_TABLET | Freq: Once | ORAL | Status: AC
  Administered 2024-02-05: 300 mg via ORAL
  Filled 2024-02-05: qty 4

## 2024-02-05 MED ORDER — CEFAZOLIN SODIUM-DEXTROSE 2-3 GM-%(50ML) IV SOLR
INTRAVENOUS | Status: DC | PRN
  Administered 2024-02-05: 2 g via INTRAVENOUS

## 2024-02-05 MED ORDER — CEFAZOLIN SODIUM-DEXTROSE 2-4 GM/100ML-% IV SOLN
2.0000 g | INTRAVENOUS | Status: DC
  Filled 2024-02-05: qty 100

## 2024-02-05 MED ORDER — SODIUM CHLORIDE 0.9 % WEIGHT BASED INFUSION
3.0000 mL/kg/h | INTRAVENOUS | Status: AC
  Administered 2024-02-05: 3 mL/kg/h via INTRAVENOUS

## 2024-02-05 MED ORDER — FENTANYL CITRATE (PF) 100 MCG/2ML IJ SOLN
INTRAMUSCULAR | Status: DC | PRN
  Administered 2024-02-05: 25 ug via INTRAVENOUS
  Administered 2024-02-05: 50 ug via INTRAVENOUS

## 2024-02-05 MED ORDER — SODIUM CHLORIDE 0.9% FLUSH: 3.0000 mL | INTRAVENOUS | Status: DC | PRN

## 2024-02-05 MED ORDER — MIDAZOLAM HCL 2 MG/2ML IJ SOLN
INTRAMUSCULAR | Status: DC | PRN
  Administered 2024-02-05 (×2): 1 mg via INTRAVENOUS

## 2024-02-05 MED ORDER — HYDRALAZINE HCL 20 MG/ML IJ SOLN: 10.0000 mg | INTRAMUSCULAR | Status: DC | PRN

## 2024-02-05 MED ORDER — ONDANSETRON HCL 4 MG/2ML IJ SOLN: 4.0000 mg | Freq: Four times a day (QID) | INTRAMUSCULAR | Status: DC | PRN

## 2024-02-05 MED ORDER — ASPIRIN 325 MG PO TBEC: 325.0000 mg | DELAYED_RELEASE_TABLET | Freq: Every day | ORAL | Status: DC

## 2024-02-05 MED ORDER — CLOPIDOGREL BISULFATE 75 MG PO TABS
75.0000 mg | ORAL_TABLET | Freq: Every day | ORAL | 5 refills | Status: DC
  Filled 2024-02-05: qty 30, 30d supply, fill #0

## 2024-02-05 MED ORDER — LIDOCAINE-EPINEPHRINE 1 %-1:100000 IJ SOLN
2.0000 mL | Freq: Once | INTRAMUSCULAR | Status: AC
  Administered 2024-02-05: 2 mL via INTRADERMAL

## 2024-02-05 MED ORDER — HEPARIN SODIUM (PORCINE) 1000 UNIT/ML IJ SOLN
INTRAMUSCULAR | Status: AC
  Filled 2024-02-05: qty 10

## 2024-02-05 MED ORDER — ASPIRIN 81 MG PO CHEW: 81.0000 mg | CHEWABLE_TABLET | ORAL | Status: DC

## 2024-02-05 MED ORDER — PANTOPRAZOLE SODIUM 40 MG PO TBEC
40.0000 mg | DELAYED_RELEASE_TABLET | Freq: Every day | ORAL | 3 refills | Status: DC
  Filled 2024-02-05: qty 30, 30d supply, fill #0

## 2024-02-05 MED ORDER — FENTANYL CITRATE (PF) 100 MCG/2ML IJ SOLN
INTRAMUSCULAR | Status: AC
  Filled 2024-02-05: qty 2

## 2024-02-05 MED ORDER — LIDOCAINE HCL (PF) 1 % IJ SOLN
INTRAMUSCULAR | Status: DC | PRN
Start: 2024-02-05 — End: 2024-02-05
  Administered 2024-02-05: 5 mL via INTRADERMAL

## 2024-02-05 MED ORDER — ACETAMINOPHEN 325 MG PO TABS: 650.0000 mg | ORAL_TABLET | ORAL | Status: DC | PRN

## 2024-02-05 MED ORDER — HEPARIN (PORCINE) IN NACL 1000-0.9 UT/500ML-% IV SOLN
INTRAVENOUS | Status: DC | PRN
  Administered 2024-02-05: 500 mL

## 2024-02-05 MED ORDER — SODIUM CHLORIDE 0.9 % IV SOLN: 250.0000 mL | INTRAVENOUS | Status: DC | PRN

## 2024-02-05 MED ORDER — CLOPIDOGREL BISULFATE 75 MG PO TABS: 75.0000 mg | ORAL_TABLET | Freq: Every day | ORAL | Status: DC

## 2024-02-05 MED ORDER — LABETALOL HCL 5 MG/ML IV SOLN: 10.0000 mg | INTRAVENOUS | Status: DC | PRN

## 2024-02-05 MED ORDER — MIDAZOLAM HCL 2 MG/2ML IJ SOLN
INTRAMUSCULAR | Status: AC
  Filled 2024-02-05: qty 2

## 2024-02-05 MED ORDER — OXIDIZED CELLULOSE EX PADS
1.0000 | MEDICATED_PAD | Freq: Once | CUTANEOUS | Status: AC
  Administered 2024-02-05: 1 via TOPICAL
  Filled 2024-02-05: qty 1

## 2024-02-05 SURGICAL SUPPLY — 16 items
CATH ACUNAV 8FR 90CM (CATHETERS) IMPLANT
CATH EXPO 5F MPA-1 (CATHETERS) IMPLANT
CLOSURE PERCLOSE PROSTYLE (VASCULAR PRODUCTS) IMPLANT
COVER SWIFTLINK CONNECTOR (BAG) IMPLANT
GUIDEWIRE SAFE TJ AMPLATZ EXST (WIRE) IMPLANT
KIT HEART LEFT (KITS) ×2 IMPLANT
OCCLUDER CARDIOFORM SEPTAL 30 (Prosthesis & Implant Heart) IMPLANT
PACK CARDIAC CATHETERIZATION (CUSTOM PROCEDURE TRAY) ×2 IMPLANT
SHEATH FAST CATH 12F 12CM (SHEATH) IMPLANT
SHEATH INTROD W/O MIN 9FR 25CM (SHEATH) IMPLANT
SHEATH PINNACLE 8F 10CM (SHEATH) IMPLANT
SHEATH PROBE COVER 6X72 (BAG) IMPLANT
TRANSDUCER W/STOPCOCK (MISCELLANEOUS) ×2 IMPLANT
TUBING CIL FLEX 10 FLL-RA (TUBING) ×2 IMPLANT
WIRE EMERALD 3MM-J .035X260CM (WIRE) IMPLANT
WIRE MICRO SET SILHO 5FR 7 (SHEATH) IMPLANT

## 2024-02-05 NOTE — Progress Notes (Signed)
   Called to bedside with patient having continued oozing from PFO closure groin stick today. Attempted to use surgicel pad but continues to have small ooze. Site injected lido w/epi 1% 2mL used. No further oozing noted. Dressing placed to site.   SignedManuelita Rummer, NP-C 02/05/2024, 2:07 PM Pager: [phone]

## 2024-02-05 NOTE — H&P (Signed)
 Cardiology Admission History and Physical   Patient ID: John [name] MRN: [id number]; DOB: 1975-11-21   Admission date: 02/05/2024  PCP:  Joesph Annabella HERO, FNP   [facility] Providers Cardiologist:  None       Chief Complaint:  CVA    History of Present Illness: The patient is a 48 year old male with a history of GERD who is here for recommendations regarding an incidentally noted PFO.  The patient presented with a thunderclap headache in April.  This was associated with right sided paresthesias and weakness.  An LP was done which was negative.  CT of the head and CTA of the head and neck were negative.  He did have an MRI which demonstrated scattered bilateral subcortical T2 signal but no acute infarctions.  A TTE was positive for an intracardiac shunt.  A TCD was positive as well with [name] grade 4 right-to-left shunting.  He has a monitor in place.  He was seen by neurology.  He is referred for recommendations regarding his incidentally noted PFO.   Last month, he experienced a significant episode characterized by numbness in his entire head and face, dizziness, and nausea. The numbness extended to the left side of his body, affecting his arm and leg, leading to instability and a sensation akin to being 'drunk'. He was taken to the hospital where he vomited for several hours, which he attributes to medication administered at the time. An MRI was performed, which did not show evidence of a stroke but revealed white matter disease. A patent foramen ovale (PFO) was also identified. Following this, a neurologist suggested that he might have experienced a transient ischemic attack (TIA).   Since the episode, he has not experienced similar spells but reports occasional headaches, though not as severe as before. He is currently wearing a heart monitor for 30 days to assess for any arrhythmias, as he occasionally experiences palpitations described as a sensation of his heart 'stopping  and then catching back up fast'.   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 This symptom has been present for a couple of years. He denies routine shortness of breath.   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 He does not smoke.  TEE demonstrated suitable anatomy for PFO closure and he is referred for this procedure today.    Past Medical History:  Diagnosis Date   GERD (gastroesophageal reflux disease)    Past Surgical History:  Procedure Laterality Date   HERNIA REPAIR     2 yrs age   TRANSESOPHAGEAL ECHOCARDIOGRAM (CATH LAB) N/A 12/22/2023   Procedure: TRANSESOPHAGEAL ECHOCARDIOGRAM;  Surgeon: Delford Maude BROCKS, MD;  Location[facility] INVASIVE CV LAB;  Service: Cardiovascular;  Laterality: N/A;     Medications Prior to Admission: Prior to Admission medications   Medication Sig Start Date End Date Taking? Authorizing Provider  acetaminophen  (TYLENOL ) 500 MG tablet Take 500 mg by mouth every 6 (six) hours as needed for moderate pain (pain score 4-6) or headache.   Yes [provider]  aspirin  EC 81 MG tablet Take 1 tablet (81 mg total) by mouth daily. Swallow whole. 11/12/23  Yes Wyn Jackee VEAR Mickey., NP  cyclobenzaprine  (FLEXERIL ) 10 MG tablet Take 1 tablet (10 mg total) by mouth 3 (three) times daily as  needed for muscle spasms. 12/04/23  Yes Joesph Annabella HERO, FNP  esomeprazole  (NEXIUM ) 40 MG capsule Take 1 capsule (40 mg total) by mouth daily at 12 noon. 01/16/23  Yes Stacia Glendia BRAVO, MD  sertraline  (ZOLOFT ) 25 MG tablet TAKE 1 TABLET (25 MG TOTAL) BY MOUTH DAILY. 01/14/24  Yes Joesph Annabella HERO, FNP  esomeprazole  (NEXIUM ) 40 MG capsule Take 1 capsule (40 mg total) by mouth 2 (two) times daily. 11/18/22   Stacia Glendia BRAVO, MD     Allergies:    Allergies   Allergen Reactions   Morphine  Nausea And Vomiting    Social History:   Social History   Socioeconomic History   Marital status: Married    Spouse name: Not on file   Number of children: 2   Years of education: Not on file   Highest education level: Not on file  Occupational History   Occupation: Patent examiner  Tobacco Use   Smoking status: Never   Smokeless tobacco: Never  Vaping Use   Vaping status: Never Used  Substance and Sexual Activity   Alcohol use: Yes    Comment: rare   Drug use: Never   Sexual activity: Not on file  Other Topics Concern   Not on file  Social History Narrative   Caffiene 1 cup coffee daily, ghost drinks 1 week   Working: Chartered loss adjuster , coaches HS baseball   Living wife, 2 boys   Social Drivers of Corporate investment banker Strain: Not on file  Food Insecurity: Not on file  Transportation Needs: Not on file  Physical Activity: Not on file  Stress: Not on file  Social Connections: Not on file  Intimate Partner Violence: Not on file     Family History:   The patient's family history includes Breast cancer in his maternal grandmother and paternal grandmother; Other in his mother. There is no history of Liver disease, Esophageal cancer, or Colon cancer.    ROS:  Please see the history of present illness.  All other ROS reviewed and negative.     Physical Exam/Data: Vitals:   02/05/24 0611  BP: 129/81  Pulse: 60  Resp: 16  Temp: 97.9 F (36.6 C)  SpO2: 97%  Weight: 110.2 kg  Height: 6' 1 (1.854 m)   No intake or output data in the 24 hours ending 02/05/24 0649    02/05/2024    6:11 AM 12/31/2023    9:59 AM 12/22/2023    7:29 AM  Last 3 Weights  Weight (lbs) 242 lb 15.2 oz 243 lb 235 lb  Weight (kg) 110.2 kg 110.224 kg 106.595 kg     Body mass index is 32.05 kg/m.  General:  Well nourished, well developed, in no acute distress HEENT: normal Neck: no JVD Vascular: No carotid bruits; Distal pulses 2+ bilaterally    Cardiac:  normal S1, S2; RRR; no murmur  Lungs:  clear to auscultation bilaterally, no wheezing, rhonchi or rales  Abd: soft, nontender, no hepatomegaly  Ext: no edema Musculoskeletal:  No deformities, BUE and BLE strength normal and equal Skin: warm and dry  Neuro:  CNs 2-12 intact, no focal abnormalities noted Psych:  Normal affect   EKG:  The ECG that was done today was personally reviewed and demonstrates NSR with inferior infarction pattern  Relevant CV Studies: TEE  1. Given MVP with dilated non coronary sinus 3.9 cm consider r/o forme  fruste of Marfans.   2. Large tunneled PFO with length 1.1 cm  and ostium 0.36 cm Positive  bubble study.   3. Left ventricular ejection fraction, by estimation, is 60 to 65%. The  left ventricle has normal function.   4. Right ventricular systolic function is normal. The right ventricular  size is normal.   5. No left atrial/left atrial appendage thrombus was detected.   6. P3 prolapse with mild/moderate anteriorly directed MR. The mitral  valve is abnormal. Mild to moderate mitral valve regurgitation.   7. The aortic valve is tricuspid. Aortic valve regurgitation is not  visualized. No aortic stenosis is present.   8. Aortic dilatation noted. There is mild dilatation of the aortic root,  measuring 39 mm. There is mild dilatation of the ascending aorta,  measuring 38 mm.   9. Evidence of atrial level shunting detected by color flow Doppler.  Agitated saline contrast bubble study was positive with shunting observed  within 3-6 cardiac cycles suggestive of interatrial shunt.  10. 3D performed of the mitral valve and 3D performed of the atrial septum  and demonstrates 3D of mitral valve and septum performed.   Laboratory Data: High Sensitivity Troponin:  No results for input(s): TROPONINIHS in the last 720 hours.    Chemistry Recent Labs  Lab 02/01/24 1420  NA 143  K 4.2  CL 106  CO2 22  GLUCOSE 85  BUN 16  CREATININE 1.44*   CALCIUM 9.9    No results for input(s): PROT, ALBUMIN, AST, ALT, ALKPHOS, BILITOT in the last 168 hours. Lipids No results for input(s): CHOL, TRIG, HDL, LABVLDL, LDLCALC, CHOLHDL in the last 168 hours. Hematology Recent Labs  Lab 02/01/24 1420  WBC 6.4  RBC 5.42  HGB 15.7  HCT 47.8  MCV 88  MCH 29.0  MCHC 32.8  RDW 13.6  PLT 231   Thyroid No results for input(s): TSH, FREET4 in the last 168 hours. BNPNo results for input(s): BNP, PROBNP in the last 168 hours.  DDimer No results for input(s): DDIMER in the last 168 hours.  Radiology/Studies:  No results found.   Assessment and Plan: PFO:  Refer to PFO closure today with 30mm Gore Cardioform device.  Plan on DAPT with plavix x 6 months; will need abx prophylaxis for 6 months as well.  Plan for same day discharge if procedure is uncomplicated. MR:  Given constellation of aortic dilation and MR, patient did have a genetic screen for Marfan's that was negative.  He will need continued cardiology follow up for his MR.  Risk Assessment/Risk Scores:        Code Status: Full Code  Severity of Illness: The appropriate patient status for this patient is OBSERVATION. Observation status is judged to be reasonable and necessary in order to provide the required intensity of service to ensure the patient's safety. The patient's presenting symptoms, physical exam findings, and initial radiographic and laboratory data in the context of their medical condition is felt to place them at decreased risk for further clinical deterioration. Furthermore, it is anticipated that the patient will be medically stable for discharge from the hospital within 2 midnights of admission.   For questions or updates, please contact [facility] Please consult [email or web address] for contact info under     Signed, Misha Vanoverbeke K Zikeria Keough, MD  02/05/2024 6:49 AM

## 2024-02-05 NOTE — Discharge Instructions (Addendum)
 Start plavix 75mg  daily tomorrow.  Take aspirin  and plavix for 6 months then stop plavix.  You need antibiotics for any dental work including cleanings for the next 6 months.   Femoral Site Care This sheet gives you information about how to care for yourself after your procedure. Your health care provider may also give you more specific instructions. If you have problems or questions, contact your health care provider. What can I expect after the procedure?  After the procedure, it is common to have: Bruising that usually fades within 1-2 weeks. Tenderness at the site. Follow these instructions at home: Wound care Follow instructions from your health care provider about how to take care of your insertion site. Make sure you: Wash your hands with soap and water before you change your bandage (dressing). If soap and water are not available, use hand sanitizer. Remove your dressing as told by your health care provider. In 24 hours Do not take baths, swim, or use a hot tub until your health care provider approves. You may shower 24-48 hours after the procedure or as told by your health care provider. Gently wash the site with plain soap and water. Pat the area dry with a clean towel. Do not rub the site. This may cause bleeding. Do not apply powder or lotion to the site. Keep the site clean and dry. Check your femoral site every day for signs of infection. Check for: Redness, swelling, or pain. Fluid or blood. Warmth. Pus or a bad smell. Activity For the first 2-3 days after your procedure, or as long as directed: Avoid climbing stairs as much as possible. Do not squat. Do not lift anything that is heavier than 10 lb (4.5 kg), or the limit that you are told, until your health care provider says that it is safe. For 5 days Rest as directed. Avoid sitting for a long time without moving. Get up to take short walks every 1-2 hours. Do not drive for 24 hours if you were given a medicine  to help you relax (sedative). General instructions Take over-the-counter and prescription medicines only as told by your health care provider. Keep all follow-up visits as told by your health care provider. This is important. Contact a health care provider if you have: A fever or chills. You have redness, swelling, or pain around your insertion site. Get help right away if: The catheter insertion area swells very fast. You pass out. You suddenly start to sweat or your skin gets clammy. The catheter insertion area is bleeding, and the bleeding does not stop when you hold steady pressure on the area. The area near or just beyond the catheter insertion site becomes pale, cool, tingly, or numb. These symptoms may represent a serious problem that is an emergency. Do not wait to see if the symptoms will go away. Get medical help right away. Call your local emergency services (911 in the [place] Do not drive yourself to the hospital. Summary After the procedure, it is common to have bruising that usually fades within 1-2 weeks. Check your femoral site every day for signs of infection. Do not lift anything that is heavier than 10 lb (4.5 kg), or the limit that you are told, until your health care provider says that it is safe. This information is not intended to replace advice given to you by your health care provider. Make sure you discuss any questions you have with your health care provider. Document Revised: 07/13/2017 Document Reviewed: 07/13/2017 Elsevier Patient  Education  [employer].

## 2024-02-05 NOTE — Progress Notes (Signed)
  Echocardiogram 2D Echocardiogram has been performed.  John [name] 02/05/2024, 11:30 AM

## 2024-02-19 ENCOUNTER — Other Ambulatory Visit: Payer: Self-pay | Admitting: Gastroenterology

## 2024-03-06 NOTE — Progress Notes (Unsigned)
 [facility]   MULTIDISCIPLINARY HEART VALVE CLINIC                                     Cardiology Office Note:    Date:  03/07/2024   ID:  John [name], DOB 1976-06-24, MRN [id number]  PCP:  John Annabella HERO, FNP  [facility] Cardiologist:  Vina Gull, MD  [facility] Structural heart: Lurena MARLA Red, MD [facility] Electrophysiologist:  None   Referring MD: John Annabella HERO, FNP   1 month s/p PFO closure   History of Present Illness:    John [name] is a 48 y.o. male with a hx of GERD, TIA, aortic root dilation, MVP and PFO s/p percutaneous PFO closure (02/05/24) who presents to clinic for follow up.   He was evaluated by cardiology in 09/2022 for evaluation of chest pain, SOB and palpitations. TTE 10/03/22 showed EF 60-65%, moderately dilated LV, mild RVE, normal pulm art pressure, mild LAE with moderate - severe MR due to prolapse of P3 and mildly dilated aortic root. TEE was recommended for further work u but not completed for unclear reasons. He then presented tp the ER with a thunderclap headache in 10/2023. This was associated with right sided paresthesias and weakness. LP was negative. CT of the head and CTA of the head and neck were negative. MRI demonstrated scattered bilateral subcortical T2 signal but no acute infarction. A TTE was positive for an intracardiac shunt. A TCD was positive as well with [name] grade 4 right-to-left shunting. Subsequent ambulatory telemetry monito was negative for afib. He was seen by neurology who felt symptoms were worrisome for TIA. TEE 12/22/23 demonstrated EF 60-65% with a long tunnel PFO amenable to closure.  Of note his mitral valve demonstrated mild to moderate P3 prolapse with mild to mod MR and aortic root dilatation which collectively may suggest Marfan syndrome. He was referred for genetic counseling.  FBN1 gene was negative. S/p successful implantation of 30 mm Gore Cardioform device with ICE imaging on 02/05/24 .Post op  limited echo showed normal LV function and no obvous atrial level shunting (not a good study) and mild to moderate MR. Discharged same day on DAPT for 6 months followed by aspirin  monotherapy indefinitely.   Today the patient presents to clinic for follow up. Here with wife. Doing well. No CP or SOB. No LE edema, orthopnea or PND. No dizziness or syncope. No blood in stool or urine. No palpitations. Has a dental apt later today.     Past Medical History:  Diagnosis Date   GERD (gastroesophageal reflux disease)      Current Medications: Current Meds  Medication Sig   acetaminophen  (TYLENOL ) 500 MG tablet Take 500 mg by mouth every 6 (six) hours as needed for moderate pain (pain score 4-6) or headache.   amoxicillin  (AMOXIL ) 500 MG capsule Take 4 capsules (2,000 mg total) by mouth as directed 1 hour prior to dental procedures, including cleanings.   aspirin  EC 81 MG tablet Take 1 tablet (81 mg total) by mouth daily. Swallow whole.   clopidogrel  (PLAVIX ) 75 MG tablet Take 1 tablet (75 mg total) by mouth daily.   cyclobenzaprine  (FLEXERIL ) 10 MG tablet Take 1 tablet (10 mg total) by mouth 3 (three) times daily as needed for muscle spasms.   pantoprazole  (PROTONIX ) 40 MG tablet Take 2 tablets (80 mg total) by mouth daily.   sertraline  (  ZOLOFT ) 25 MG tablet TAKE 1 TABLET (25 MG TOTAL) BY MOUTH DAILY.   [DISCONTINUED] clopidogrel  (PLAVIX ) 75 MG tablet Take 1 tablet (75 mg total) by mouth daily.   [DISCONTINUED] pantoprazole  (PROTONIX ) 40 MG tablet Take 1 tablet (40 mg total) by mouth daily.      ROS:   Please see the history of present illness.    All other systems reviewed and are negative.  EKGs       Risk Assessment/Calculations:            Physical Exam:    VS:  BP 114/88   Pulse 65   Ht 6' 1 (1.854 m)   Wt 244 lb 6.4 oz (110.9 kg)   SpO2 96%   BMI 32.24 kg/m     Wt Readings from Last 3 Encounters:  03/07/24 244 lb 6.4 oz (110.9 kg)  02/05/24 242 lb 15.2 oz (110.2  kg)  12/31/23 243 lb (110.2 kg)     GEN: Well nourished, well developed in no acute distress NECK: No JVD CARDIAC: RRR, 2/6 holosystolic murmur heard best at apex.  no rubs, gallops RESPIRATORY:  Clear to auscultation without rales, wheezing or rhonchi  ABDOMEN: Soft, non-tender, non-distended EXTREMITIES:  No edema; No deformity.    ASSESSMENT:    1. S/P percutaneous patent foramen ovale closure   2. Mitral valve prolapse   3. Aortic dilatation (HCC)   4. TIA (transient ischemic attack)   5. Gastroesophageal reflux disease, unspecified whether esophagitis present     PLAN:    In order of problems listed above:  PFO s/p PFO closure:  -- Pt doing great s/p PFO closure. -- SBE prophylaxis discussed; I have RX'd amoxicillin . He understands this is only for 6 months.   -- Continue Aspirin  81mg  daily and Plavix  75mg  daily x 6 months and then continue aspirin  alone. -- Stop Plavix  on 08/07/24 -- I will see back for 1 year echo/bubble and OV.  MVP with mod MR:  -- Will do a complete echo in 1 year.   Aortic root dilation: -- With MVP and aortic dilitatin there was concern for marfans syndrome and he was referred to Dr. Nicholes with genetic counseling. -- FBN1 gene was negative.   TIA: -- S/p PFO closure. -- Continue aspirin  81 mg daily.   GERD:  -- Currently on Protonix  instead of Nexium  due to drug drug interaction with Plavix .  -- 40mg  has not worked well for him so I will call in 80mg .  -- He may resume Nexium  when he completes his 6 months of DAPT.    Medication Adjustments/Labs and Tests Ordered: Current medicines are reviewed at length with the patient today.  Concerns regarding medicines are outlined above.  Orders Placed This Encounter  Procedures   ECHOCARDIOGRAM COMPLETE BUBBLE STUDY   Meds ordered this encounter  Medications   amoxicillin  (AMOXIL ) 500 MG capsule    Sig: Take 4 capsules (2,000 mg total) by mouth as directed 1 hour prior to dental procedures,  including cleanings.    Dispense:  12 capsule    Refill:  12   clopidogrel  (PLAVIX ) 75 MG tablet    Sig: Take 1 tablet (75 mg total) by mouth daily.    Dispense:  90 tablet    Refill:  0    Supervising Provider:   COOPER, Tytus [[id number]]   pantoprazole  (PROTONIX ) 40 MG tablet    Sig: Take 2 tablets (80 mg total) by mouth daily.    Dispense:  180  tablet    Refill:  0    Supervising Provider:   WONDA SHARPER [[id number]]    Patient Instructions  Medication Instructions:  Start Amoxicillin  500 mg, take 4 tablets by mouth 1 hour prior to dental procedures and cleanings.   Refills of Plavix  and Nexium  sent to [facility] as well.  *If you need a refill on your cardiac medications before your next appointment, please call your pharmacy*  Lab Work: None needed If you have labs (blood work) drawn today and your tests are completely normal, you will receive your results only by: MyChart Message (if you have MyChart) OR A paper copy in the mail If you have any lab test that is abnormal or we need to change your treatment, we will call you to review the results.  Testing/Procedures: 01/2025 Your physician has requested that you have an echocardiogram. Echocardiography is a painless test that uses sound waves to create images of your heart. It provides your doctor with information about the size and shape of your heart and how well your heart's chambers and valves are working. This procedure takes approximately one hour. There are no restrictions for this procedure. Please do NOT wear cologne, perfume, aftershave, or lotions (deodorant is allowed). Please arrive 15 minutes prior to your appointment time.  Please note: We ask at that you not bring children with you during ultrasound (echo/ vascular) testing. Due to room size and safety concerns, children are not allowed in the ultrasound rooms during exams. Our front office staff cannot provide observation of children in our lobby area while testing  is being conducted. An adult accompanying a patient to their appointment will only be allowed in the ultrasound room at the discretion of the ultrasound technician under special circumstances. We apologize for any inconvenience.   Follow-Up: At [facility], you and your health needs are our priority.  As part of our continuing mission to provide you with exceptional heart care, our providers are all part of one team.  This team includes your primary Cardiologist (physician) and Advanced Practice Providers or APPs (Physician Assistants and Nurse Practitioners) who all work together to provide you with the care you need, when you need it.  Your next appointment:   As scheduled  Provider:   Izetta Hummer, PA-C    We recommend signing up for the patient portal called MyChart.  Sign up information is provided on this After Visit Summary.  MyChart is used to connect with patients for Virtual Visits (Telemedicine).  Patients are able to view lab/test results, encounter notes, upcoming appointments, etc.  Non-urgent messages can be sent to your provider as well.   To learn more about what you can do with MyChart, go to [email or web address].        Signed, Lamarr Hummer, PA-C  03/07/2024 10:46 AM    [facility]

## 2024-03-07 ENCOUNTER — Ambulatory Visit: Admitting: Family Medicine

## 2024-03-07 ENCOUNTER — Ambulatory Visit: Attending: Physician Assistant | Admitting: Physician Assistant

## 2024-03-07 ENCOUNTER — Other Ambulatory Visit (HOSPITAL_COMMUNITY): Payer: Self-pay

## 2024-03-07 VITALS — BP 114/88 | HR 65 | Ht 73.0 in | Wt 244.4 lb

## 2024-03-07 DIAGNOSIS — Z8774 Personal history of (corrected) congenital malformations of heart and circulatory system: Secondary | ICD-10-CM

## 2024-03-07 DIAGNOSIS — G459 Transient cerebral ischemic attack, unspecified: Secondary | ICD-10-CM

## 2024-03-07 DIAGNOSIS — I341 Nonrheumatic mitral (valve) prolapse: Secondary | ICD-10-CM

## 2024-03-07 DIAGNOSIS — K219 Gastro-esophageal reflux disease without esophagitis: Secondary | ICD-10-CM | POA: Diagnosis not present

## 2024-03-07 DIAGNOSIS — I77819 Aortic ectasia, unspecified site: Secondary | ICD-10-CM | POA: Diagnosis not present

## 2024-03-07 MED ORDER — PANTOPRAZOLE SODIUM 40 MG PO TBEC
80.0000 mg | DELAYED_RELEASE_TABLET | Freq: Every day | ORAL | 0 refills | Status: DC
  Filled 2024-03-07: qty 180, 90d supply, fill #0

## 2024-03-07 MED ORDER — AMOXICILLIN 500 MG PO CAPS
2000.0000 mg | ORAL_CAPSULE | ORAL | 12 refills | Status: DC
  Filled 2024-03-07: qty 12, 3d supply, fill #0

## 2024-03-07 MED ORDER — CLOPIDOGREL BISULFATE 75 MG PO TABS
75.0000 mg | ORAL_TABLET | Freq: Every day | ORAL | 0 refills | Status: DC
  Filled 2024-03-07: qty 90, 90d supply, fill #0

## 2024-03-07 NOTE — Patient Instructions (Signed)
 Medication Instructions:  Start Amoxicillin  500 mg, take 4 tablets by mouth 1 hour prior to dental procedures and cleanings.   Refills of Plavix  and Nexium  sent to [facility] as well.  *If you need a refill on your cardiac medications before your next appointment, please call your pharmacy*  Lab Work: None needed If you have labs (blood work) drawn today and your tests are completely normal, you will receive your results only by: MyChart Message (if you have MyChart) OR A paper copy in the mail If you have any lab test that is abnormal or we need to change your treatment, we will call you to review the results.  Testing/Procedures: 01/2025 Your physician has requested that you have an echocardiogram. Echocardiography is a painless test that uses sound waves to create images of your heart. It provides your doctor with information about the size and shape of your heart and how well your heart's chambers and valves are working. This procedure takes approximately one hour. There are no restrictions for this procedure. Please do NOT wear cologne, perfume, aftershave, or lotions (deodorant is allowed). Please arrive 15 minutes prior to your appointment time.  Please note: We ask at that you not bring children with you during ultrasound (echo/ vascular) testing. Due to room size and safety concerns, children are not allowed in the ultrasound rooms during exams. Our front office staff cannot provide observation of children in our lobby area while testing is being conducted. An adult accompanying a patient to their appointment will only be allowed in the ultrasound room at the discretion of the ultrasound technician under special circumstances. We apologize for any inconvenience.   Follow-Up: At [facility], you and your health needs are our priority.  As part of our continuing mission to provide you with exceptional heart care, our providers are all part of one team.  This team includes your  primary Cardiologist (physician) and Advanced Practice Providers or APPs (Physician Assistants and Nurse Practitioners) who all work together to provide you with the care you need, when you need it.  Your next appointment:   As scheduled  Provider:   Izetta Hummer, PA-C    We recommend signing up for the patient portal called MyChart.  Sign up information is provided on this After Visit Summary.  MyChart is used to connect with patients for Virtual Visits (Telemedicine).  Patients are able to view lab/test results, encounter notes, upcoming appointments, etc.  Non-urgent messages can be sent to your provider as well.   To learn more about what you can do with MyChart, go to [email or web address].

## 2024-04-12 ENCOUNTER — Other Ambulatory Visit: Payer: Self-pay | Admitting: Family Medicine

## 2024-04-12 DIAGNOSIS — F411 Generalized anxiety disorder: Secondary | ICD-10-CM

## 2024-05-20 ENCOUNTER — Other Ambulatory Visit: Payer: Self-pay | Admitting: *Deleted

## 2024-05-20 DIAGNOSIS — F411 Generalized anxiety disorder: Secondary | ICD-10-CM

## 2024-05-20 NOTE — Telephone Encounter (Signed)
 Tiffany pt NTBS 30-d given 04/12/24

## 2024-05-20 NOTE — Telephone Encounter (Signed)
 Patient has appt 11-12 with [facility]

## 2024-05-25 ENCOUNTER — Encounter: Payer: Self-pay | Admitting: Family Medicine

## 2024-05-25 ENCOUNTER — Ambulatory Visit: Admitting: Family Medicine

## 2024-05-25 VITALS — BP 126/73 | HR 71 | Temp 98.2°F | Ht 73.0 in | Wt 248.2 lb

## 2024-05-25 DIAGNOSIS — F411 Generalized anxiety disorder: Secondary | ICD-10-CM

## 2024-05-25 DIAGNOSIS — I341 Nonrheumatic mitral (valve) prolapse: Secondary | ICD-10-CM | POA: Insufficient documentation

## 2024-05-25 DIAGNOSIS — Z8774 Personal history of (corrected) congenital malformations of heart and circulatory system: Secondary | ICD-10-CM

## 2024-05-25 DIAGNOSIS — I77810 Thoracic aortic ectasia: Secondary | ICD-10-CM | POA: Insufficient documentation

## 2024-05-25 DIAGNOSIS — K219 Gastro-esophageal reflux disease without esophagitis: Secondary | ICD-10-CM

## 2024-05-25 MED ORDER — SERTRALINE HCL 25 MG PO TABS: 25.0000 mg | ORAL_TABLET | Freq: Every day | ORAL | 3 refills | Status: AC

## 2024-05-25 NOTE — Progress Notes (Signed)
 Established Patient Office Visit  Subjective   Patient ID: John [name], male    DOB: 1976/05/04  Age: 48 y.o. MRN: [id number]  Chief Complaint  Patient presents with   Medical Management of Chronic Issues    HPI  History of Present Illness   John [name] is a 48 year old male who presents for follow-up after a PFO closure.  Post-pfo closure status - Underwent PFO closure in August 2025 - Experiencing overall improvement since the procedure - Not yet able to resume high-intensity activities such as running - Currently taking Plavix  (to continue until August 07, 2024) and aspirin  (to continue indefinitely)  Gastroesophageal reflux symptoms - Experiencing mild acid reflux symptoms - Previously managed effectively with Protonix , but this medication is not compatible with Plavix  - Currently using Nexium , which he finds less effective, occasionally requiring a doubled dose for symptom relief - Plans to resume Protonix  after completing Plavix  therapy  Mood and anxiety symptoms - Taking Zoloft  for anxiety and depression - Mood symptoms are well controlled - No side effects from Zoloft         05/25/2024    8:41 AM 12/04/2023    9:23 AM 10/23/2023    9:57 AM  Depression screen PHQ 2/9  Decreased Interest 0 0 1  Down, Depressed, Hopeless 0 0 0  PHQ - 2 Score 0 0 1  Altered sleeping 2 0 1  Tired, decreased energy 2 0 1  Change in appetite 0 0 0  Feeling bad or failure about yourself  0 0 0  Trouble concentrating 0 0 0  Moving slowly or fidgety/restless 0 0 2  Suicidal thoughts 0 0 0  PHQ-9 Score 4 0  5   Difficult doing work/chores Not difficult at all Not difficult at all Somewhat difficult     Data saved with a previous flowsheet row definition      05/25/2024    8:44 AM 12/04/2023    9:24 AM 10/23/2023    9:58 AM 05/27/2023    2:02 PM  GAD 7 : Generalized Anxiety Score  Nervous, Anxious, on Edge 0 0 2 0  Control/stop worrying 0 0 1 0  Worry too much - different  things 0 0 1 0  Trouble relaxing 0 0 2 0  Restless 0 0 1 0  Easily annoyed or irritable 0 0 3 0  Afraid - awful might happen 0 0 0 0  Total GAD 7 Score 0 0 10 0  Anxiety Difficulty Not difficult at all Not difficult at all Somewhat difficult Not difficult at all       ROS As per HPI.   Objective:     BP 126/73   Pulse 71   Temp 98.2 F (36.8 C) (Temporal)   Ht 6' 1 (1.854 m)   Wt 248 lb 3.2 oz (112.6 kg)   SpO2 97%   BMI 32.75 kg/m    Physical Exam Vitals and nursing note reviewed.  Constitutional:      General: He is not in acute distress.    Appearance: Normal appearance. He is not ill-appearing, toxic-appearing or diaphoretic.  Cardiovascular:     Rate and Rhythm: Normal rate and regular rhythm.     Pulses: Normal pulses.     Heart sounds: Murmur heard.     Systolic murmur is present with a grade of 2/6.  Pulmonary:     Effort: Pulmonary effort is normal. No respiratory distress.     Breath sounds: Normal breath sounds.  Musculoskeletal:     Right upper leg: No swelling, edema or deformity.     Right lower leg: No swelling or deformity. No edema.     Left lower leg: No edema.  Skin:    General: Skin is warm and dry.  Neurological:     General: No focal deficit present.     Mental Status: He is alert and oriented to person, place, and time.  Psychiatric:        Mood and Affect: Mood normal.        Behavior: Behavior normal.      No results found for any visits on 05/25/24.    The ASCVD Risk score (Arnett DK, et al., 2019) failed to calculate for the following reasons:   Risk score cannot be calculated because patient has a medical history suggesting prior/existing ASCVD    Assessment & Plan:   John [name] was seen today for medical management of chronic issues.  Diagnoses and all orders for this visit:  Generalized anxiety disorder -     sertraline  (ZOLOFT ) 25 MG tablet; Take 1 tablet (25 mg total) by mouth daily.  Gastroesophageal reflux  disease, unspecified whether esophagitis present  S/P percutaneous patent foramen ovale closure   Assessment and Plan    Status post patent foramen ovale closure Status post PFO closure in August with symptom improvement. On dual antiplatelet therapy. - Continue follow up with cardiology.  - Continue Plavix  until January 25th, 2026 per cardiology. - Continue aspirin  indefinitely.  Gastroesophageal reflux disease GERD symptoms persist due to interaction with Plavix . Temporary relief with extra doses of current medication. - Continue current GERD medication with extra doses as needed. - Switch back to Protonix  after completing current medication.  Generalized anxiety disorder Well-managed with Zoloft .  - Continue Zoloft . - Sent in refills for Zoloft .        Return in about 6 months (around 11/22/2024) for CPE.   The patient indicates understanding of these issues and agrees with the plan.  [name], FNP

## 2024-06-08 ENCOUNTER — Other Ambulatory Visit: Payer: Self-pay | Admitting: Physician Assistant

## 2024-06-08 ENCOUNTER — Other Ambulatory Visit (HOSPITAL_COMMUNITY): Payer: Self-pay

## 2024-06-08 DIAGNOSIS — G459 Transient cerebral ischemic attack, unspecified: Secondary | ICD-10-CM

## 2024-06-08 MED ORDER — CLOPIDOGREL BISULFATE 75 MG PO TABS
75.0000 mg | ORAL_TABLET | Freq: Every day | ORAL | 1 refills | Status: AC
  Filled 2024-06-08: qty 90, 90d supply, fill #0

## 2024-06-17 ENCOUNTER — Other Ambulatory Visit (HOSPITAL_COMMUNITY): Payer: Self-pay

## 2024-07-29 ENCOUNTER — Other Ambulatory Visit: Payer: Self-pay | Admitting: Gastroenterology

## 2024-07-29 ENCOUNTER — Telehealth: Payer: Self-pay | Admitting: Gastroenterology

## 2024-07-29 MED ORDER — ESOMEPRAZOLE MAGNESIUM 40 MG PO CPDR: 40.0000 mg | DELAYED_RELEASE_CAPSULE | Freq: Two times a day (BID) | ORAL | 1 refills | Status: AC

## 2024-07-29 NOTE — Telephone Encounter (Signed)
 PT is calling to have a new prescription for esomeprazole  sent to [facility] in [place]. Requesting a call back.

## 2024-07-29 NOTE — Telephone Encounter (Signed)
 Informed patient he is over due for a follow up visit with Dr. Stacia but I can schedule an appt and send refills to his pharmacy until his appt. Patient verbalized understanding. Patient has been scheduled to see Dr. Stacia on 08/25/24 at 3:20 pm. Prescription sent to the pharmacy.

## 2024-08-25 ENCOUNTER — Ambulatory Visit: Admitting: Gastroenterology

## 2025-02-06 ENCOUNTER — Ambulatory Visit: Admitting: Physician Assistant

## 2025-02-06 ENCOUNTER — Other Ambulatory Visit (HOSPITAL_COMMUNITY)
